# Patient Record
Sex: Female | Born: 1995 | Race: Black or African American | Hispanic: No | Marital: Single | Smoking: Never smoker
Health system: Southern US, Community
[De-identification: ages and names within clinical notes are randomized; demographics above are authoritative.]

## PROBLEM LIST (undated history)

## (undated) ENCOUNTER — Inpatient Hospital Stay (HOSPITAL_COMMUNITY): Payer: Self-pay

## (undated) ENCOUNTER — Inpatient Hospital Stay (HOSPITAL_COMMUNITY): Admission: RE | Payer: Self-pay | Source: Ambulatory Visit

## (undated) DIAGNOSIS — O34219 Maternal care for unspecified type scar from previous cesarean delivery: Secondary | ICD-10-CM

## (undated) DIAGNOSIS — Z789 Other specified health status: Secondary | ICD-10-CM

## (undated) DIAGNOSIS — Z3490 Encounter for supervision of normal pregnancy, unspecified, unspecified trimester: Secondary | ICD-10-CM

## (undated) DIAGNOSIS — O0932 Supervision of pregnancy with insufficient antenatal care, second trimester: Secondary | ICD-10-CM

## (undated) DIAGNOSIS — F32A Depression, unspecified: Secondary | ICD-10-CM

## (undated) HISTORY — DX: Depression, unspecified: F32.A

## (undated) HISTORY — PX: FRACTURE SURGERY: SHX138

## (undated) HISTORY — PX: OTHER SURGICAL HISTORY: SHX169

---
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
## 2016-07-21 ENCOUNTER — Inpatient Hospital Stay (HOSPITAL_COMMUNITY)
Admission: AD | Admit: 2016-07-21 | Discharge: 2016-07-21 | Disposition: A | Discharge status: Home / Self Care | Payer: Medicaid Other | Source: Ambulatory Visit | Attending: Obstetrics & Gynecology | Admitting: Obstetrics & Gynecology

## 2016-07-21 ENCOUNTER — Encounter (HOSPITAL_COMMUNITY): Payer: Self-pay | Admitting: *Deleted

## 2016-07-21 ENCOUNTER — Inpatient Hospital Stay (HOSPITAL_COMMUNITY): Payer: Medicaid Other

## 2016-07-21 ENCOUNTER — Other Ambulatory Visit: Payer: Self-pay | Admitting: Family

## 2016-07-21 DIAGNOSIS — O0001 Abdominal pregnancy with intrauterine pregnancy: Secondary | ICD-10-CM | POA: Diagnosis not present

## 2016-07-21 DIAGNOSIS — Z3A13 13 weeks gestation of pregnancy: Secondary | ICD-10-CM | POA: Insufficient documentation

## 2016-07-21 DIAGNOSIS — Z789 Other specified health status: Secondary | ICD-10-CM

## 2016-07-21 DIAGNOSIS — IMO0002 Reserved for concepts with insufficient information to code with codable children: Secondary | ICD-10-CM

## 2016-07-21 DIAGNOSIS — Z362 Encounter for other antenatal screening follow-up: Secondary | ICD-10-CM | POA: Diagnosis present

## 2016-07-21 DIAGNOSIS — Z3A15 15 weeks gestation of pregnancy: Secondary | ICD-10-CM | POA: Diagnosis not present

## 2016-07-21 DIAGNOSIS — Z3201 Encounter for pregnancy test, result positive: Secondary | ICD-10-CM

## 2016-07-21 DIAGNOSIS — Z3491 Encounter for supervision of normal pregnancy, unspecified, first trimester: Secondary | ICD-10-CM

## 2016-07-21 HISTORY — DX: Other specified health status: Z78.9

## 2016-07-21 LAB — CBC
HCT: 34.8 % — ABNORMAL LOW (ref 36.0–46.0)
HEMOGLOBIN: 12.3 g/dL (ref 12.0–15.0)
MCH: 30.4 pg (ref 26.0–34.0)
MCHC: 35.3 g/dL (ref 30.0–36.0)
MCV: 86.1 fL (ref 78.0–100.0)
PLATELETS: 236 10*3/uL (ref 150–400)
RBC: 4.04 MIL/uL (ref 3.87–5.11)
RDW: 13.3 % (ref 11.5–15.5)
WBC: 6 10*3/uL (ref 4.0–10.5)

## 2016-07-21 LAB — HCG, QUANTITATIVE, PREGNANCY: hCG, Beta Chain, Quant, S: 100589 m[IU]/mL — ABNORMAL HIGH (ref <5)

## 2016-07-21 LAB — ABO/RH: ABO/RH(D): O POS

## 2016-07-21 NOTE — Discharge Instructions (Signed)
How a Baby Grows During Pregnancy Introduction Pregnancy begins when a female's sperm enters a female's egg (fertilization). This happens in one of the tubes (fallopian tubes) that connect the ovaries to the womb (uterus). The fertilized egg is called an embryo until it reaches 10 weeks. From 10 weeks until birth, it is called a fetus. The fertilized egg moves down the fallopian tube to the uterus. Then it implants into the lining of the uterus and begins to grow. The developing fetus receives oxygen and nutrients through the pregnant woman's bloodstream and the tissues that grow (placenta) to support the fetus. The placenta is the life support system for the fetus. It provides nutrition and removes waste. Learning as much as you can about your pregnancy and how your baby is developing can help you enjoy the experience. It can also make you aware of when there might be a problem and when to ask questions. How long does a typical pregnancy last? A pregnancy usually lasts 280 days, or about 40 weeks. Pregnancy is divided into three trimesters:  First trimester: 0-13 weeks.  Second trimester: 14-27 weeks.  Third trimester: 28-40 weeks. The day when your baby is considered ready to be born (full term) is your estimated date of delivery. How does my baby develop month by month? First month  The fertilized egg attaches to the inside of the uterus.  Some cells will form the placenta. Others will form the fetus.  The arms, legs, brain, spinal cord, lungs, and heart begin to develop.  At the end of the first month, the heart begins to beat. Second month  The bones, inner ear, eyelids, hands, and feet form.  The genitals develop.  By the end of 8 weeks, all major organs are developing. Third month  All of the internal organs are forming.  Teeth develop below the gums.  Bones and muscles begin to grow. The spine can flex.  The skin is transparent.  Fingernails and toenails begin to  form.  Arms and legs continue to grow longer, and hands and feet develop.  The fetus is about 3 in (7.6 cm) long. Fourth month  The placenta is completely formed.  The external sex organs, neck, outer ear, eyebrows, eyelids, and fingernails are formed.  The fetus can hear, swallow, and move its arms and legs.  The kidneys begin to produce urine.  The skin is covered with a white waxy coating (vernix) and very fine hair (lanugo). Fifth month  The fetus moves around more and can be felt for the first time (quickening).  The fetus starts to sleep and wake up and may begin to suck its finger.  The nails grow to the end of the fingers.  The organ in the digestive system that makes bile (gallbladder) functions and helps to digest the nutrients.  If your baby is a girl, eggs are present in her ovaries. If your baby is a boy, testicles start to move down into his scrotum. Sixth month  The lungs are formed, but the fetus is not yet able to breathe.  The eyes open. The brain continues to develop.  Your baby has fingerprints and toe prints. Your baby's hair grows thicker.  At the end of the second trimester, the fetus is about 9 in (22.9 cm) long. Seventh month  The fetus kicks and stretches.  The eyes are developed enough to sense changes in light.  The hands can make a grasping motion.  The fetus responds to sound. Eighth month    All organs and body systems are fully developed and functioning.  Bones harden and taste buds develop. The fetus may hiccup.  Certain areas of the brain are still developing. The skull remains soft. Ninth month  The fetus gains about  lb (0.23 kg) each week.  The lungs are fully developed.  Patterns of sleep develop.  The fetus's head typically moves into a head-down position (vertex) in the uterus to prepare for birth. If the buttocks move into a vertex position instead, the baby is breech.  The fetus weighs 6-9 lbs (2.72-4.08 kg) and is  19-20 in (48.26-50.8 cm) long. What can I do to have a healthy pregnancy and help my baby develop?  Eating and Drinking  Eat a healthy diet.  Talk with your health care provider to make sure that you are getting the nutrients that you and your baby need.  Visit www.choosemyplate.gov to learn about creating a healthy diet.  Gain a healthy amount of weight during pregnancy as advised by your health care provider. This is usually 25-35 pounds. You may need to:  Gain more if you were underweight before getting pregnant or if you are pregnant with more than one baby.  Gain less if you were overweight or obese when you got pregnant. Medicines and Vitamins  Take prenatal vitamins as directed by your health care provider. These include vitamins such as folic acid, iron, calcium, and vitamin D. They are important for healthy development.  Take medicines only as directed by your health care provider. Read labels and ask a pharmacist or your health care provider whether over-the-counter medicines, supplements, and prescription drugs are safe to take during pregnancy. Activities  Be physically active as advised by your health care provider. Ask your health care provider to recommend activities that are safe for you to do, such as walking or swimming.  Do not participate in strenuous or extreme sports.  Lifestyle  Do not drink alcohol.  Do not use any tobacco products, including cigarettes, chewing tobacco, or electronic cigarettes. If you need help quitting, ask your health care provider.  Do not use illegal drugs. Safety  Avoid exposure to mercury, lead, or other heavy metals. Ask your health care provider about common sources of these heavy metals.  Avoid listeria infection during pregnancy. Follow these precautions:  Do not eat soft cheeses or deli meats.  Do not eat hot dogs unless they have been warmed up to the point of steaming, such as in the microwave oven.  Do not drink  unpasteurized milk.  Avoid toxoplasmosis infection during pregnancy. Follow these precautions:  Do not change your cat's litter box, if you have a cat. Ask someone else to do this for you.  Wear gardening gloves while working in the yard. General Instructions  Keep all follow-up visits as directed by your health care provider. This is important. This includes prenatal care and screening tests.  Manage any chronic health conditions. Work closely with your health care provider to keep conditions, such as diabetes, under control. How do I know if my baby is developing well? At each prenatal visit, your health care provider will do several different tests to check on your health and keep track of your baby's development. These include:  Fundal height.  Your health care provider will measure your growing belly from top to bottom using a tape measure.  Your health care provider will also feel your belly to determine your baby's position.  Heartbeat.  An ultrasound in the first trimester   can confirm pregnancy and show a heartbeat, depending on how far along you are.  Your health care provider will check your baby's heart rate at every prenatal visit.  As you get closer to your delivery date, you may have regular fetal heart rate monitoring to make sure that your baby is not in distress.  Second trimester ultrasound.  This ultrasound checks your baby's development. It also indicates your baby's gender. What should I do if I have concerns about my baby's development? Always talk with your health care provider about any concerns that you may have. This information is not intended to replace advice given to you by your health care provider. Make sure you discuss any questions you have with your health care provider. Document Released: 12/03/2007 Document Revised: 11/22/2015 Document Reviewed: 11/23/2013  2017 [employer]  

## 2016-07-21 NOTE — [facility] Provider Note (Signed)
History     CSN: [id number]  Arrival date and time: 07/21/16 1125   First Provider Initiated Contact with Patient 07/21/16 1216      No chief complaint on file.  HPI   [name] is a 21 y.o. female G1P0 @ [redacted]w[redacted]d by uncertain LMP here in [facility] with concerns about her baby. She was seen today by her school nurse and the nurse was unable to doppler fetal heart tones. She denies abdominal pain or vaginal bleeding.   OB History    Gravida Para Term Preterm AB Living   1         0   SAB TAB Ectopic Multiple Live Births                  Past Medical History:  Diagnosis Date  . Medical history non-contributory     Past Surgical History:  Procedure Laterality Date  . NO PAST SURGERIES      History reviewed. No pertinent family history.  Social History  Substance Use Topics  . Smoking status: Never Smoker  . Smokeless tobacco: Never Used  . Alcohol use No    Allergies: No Known Allergies  Prescriptions Prior to Admission  Medication Sig Dispense Refill Last Dose  . acetaminophen (TYLENOL) 500 MG tablet Take 1,000 mg by mouth every 6 (six) hours as needed for mild pain or headache.   Past Week at Unknown time  . Prenatal Vit-Fe Fumarate-FA (PRENATAL MULTIVITAMIN) TABS tablet Take 1 tablet by mouth at bedtime.   07/20/2016 at Unknown time   Results for orders placed or performed during the hospital encounter of 07/21/16 (from the past 48 hour(s))  hCG, quantitative, pregnancy     Status: Abnormal   Collection Time: 07/21/16 12:26 PM  Result Value Ref Range   hCG, Beta Chain, Quant, S 100,589 (H) <5 mIU/mL    Comment:          GEST. AGE      CONC.  (mIU/mL)   <=1 WEEK        5 - 50     2 WEEKS       50 - 500     3 WEEKS       100 - 10,000     4 WEEKS     1,000 - 30,000     5 WEEKS     3,500 - 115,000   6-8 WEEKS     12,000 - 270,000    12 WEEKS     15,000 - 220,000        FEMALE AND NON-PREGNANT FEMALE:     LESS THAN 5 mIU/mL   ABO/Rh     Status: None   Collection Time: 07/21/16 12:26 PM  Result Value Ref Range   ABO/RH(D) O POS   CBC     Status: Abnormal   Collection Time: 07/21/16 12:26 PM  Result Value Ref Range   WBC 6.0 4.0 - 10.5 K/uL   RBC 4.04 3.87 - 5.11 MIL/uL   Hemoglobin 12.3 12.0 - 15.0 g/dL   HCT 40.9 (L) 81.1 - 91.4 %   MCV 86.1 78.0 - 100.0 fL   MCH 30.4 26.0 - 34.0 pg   MCHC 35.3 30.0 - 36.0 g/dL   RDW 78.2 95.6 - 21.3 %   Platelets 236 150 - 400 K/uL   US Ob Comp Less 14 Wks  Result Date: 07/21/2016 CLINICAL DATA:  Pregnant, size less than dates on physical exam EXAM: OBSTETRIC <14  WK ULTRASOUND TECHNIQUE: Transabdominal ultrasound was performed for evaluation of the gestation as well as the maternal uterus and adnexal regions. COMPARISON:  None. FINDINGS: Intrauterine gestational sac: Single Yolk sac:  Visualized. Embryo:  Visualized. Cardiac Activity: Visualized. Heart Rate: 154 bpm CRL:   74.5  mm   13 w 4 d                  US EDC: 01/22/2017 Subchorionic hemorrhage:  None visualized. Maternal uterus/adnexae: Bilateral ovaries are within normal limits. No free fluid. IMPRESSION: Single live intrauterine gestation, with estimated gestational age [redacted] weeks 4 days by crown-rump length. Electronically Signed   By: Charline BillsSriyesh  Krishnan M.D.   On: 07/21/2016 13:55   Review of Systems  Gastrointestinal: Negative for abdominal pain.  Genitourinary: Negative for dysuria and vaginal bleeding.   Physical Exam   Blood pressure 116/67, pulse 85, temperature 97.7 F (36.5 C), temperature source Oral, resp. rate 16, height 5\' 8"  (1.727 m), weight 211 lb (95.7 kg), last menstrual period 04/01/2016.  Physical Exam  Constitutional: She is oriented to person, place, and time. She appears well-developed and well-nourished. No distress.  HENT:  Head: Normocephalic.  Respiratory: Effort normal.  GI: Soft.  Musculoskeletal: Normal range of motion.  Neurological: She is alert and oriented to person, place, and time.  Skin: Skin is  warm. She is not diaphoretic.  Psychiatric: Her behavior is normal.    MAU Course  Procedures  None  MDM  ABO/RH CBC Hcg US  Unable to doppler fetal heart tones via doppler   Assessment and Plan   A:  SIUP @ [id number]  1. Normal IUP (intrauterine pregnancy) on prenatal ultrasound, first trimester   2. Absent fetal heart tones   3. Date of last menstrual period (LMP) unknown     P:  Discharge home in stable condition  Start prenatal care.  Return to MAU if symptoms worsen  Prenatal vitamins.    Duane LopeJennifer I Anastasio Wogan, NP 07/21/2016 4:07 PM

## 2016-07-21 NOTE — MAU Note (Addendum)
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  Was seen today at [facility], they could not hear a FHR.  Pt denies pain or bleeding.  [facility]

## 2016-07-25 ENCOUNTER — Ambulatory Visit (HOSPITAL_COMMUNITY): Payer: Medicaid Other

## 2016-08-01 ENCOUNTER — Ambulatory Visit (HOSPITAL_COMMUNITY): Admission: RE | Admit: 2016-08-01 | Payer: Medicaid Other | Source: Ambulatory Visit

## 2016-09-13 ENCOUNTER — Inpatient Hospital Stay (HOSPITAL_COMMUNITY)
Admission: AD | Admit: 2016-09-13 | Discharge: 2016-09-13 | Disposition: A | Discharge status: Home / Self Care | Payer: Medicaid Other | Source: Ambulatory Visit | Attending: Obstetrics & Gynecology | Admitting: Obstetrics & Gynecology

## 2016-09-13 ENCOUNTER — Encounter (HOSPITAL_COMMUNITY): Payer: Self-pay | Admitting: *Deleted

## 2016-09-13 ENCOUNTER — Inpatient Hospital Stay (HOSPITAL_COMMUNITY): Payer: Medicaid Other

## 2016-09-13 DIAGNOSIS — O26899 Other specified pregnancy related conditions, unspecified trimester: Secondary | ICD-10-CM

## 2016-09-13 DIAGNOSIS — O368120 Decreased fetal movements, second trimester, not applicable or unspecified: Secondary | ICD-10-CM | POA: Diagnosis not present

## 2016-09-13 DIAGNOSIS — Z79899 Other long term (current) drug therapy: Secondary | ICD-10-CM | POA: Diagnosis not present

## 2016-09-13 DIAGNOSIS — Z3A21 21 weeks gestation of pregnancy: Secondary | ICD-10-CM

## 2016-09-13 DIAGNOSIS — R109 Unspecified abdominal pain: Secondary | ICD-10-CM | POA: Insufficient documentation

## 2016-09-13 DIAGNOSIS — O212 Late vomiting of pregnancy: Secondary | ICD-10-CM | POA: Diagnosis not present

## 2016-09-13 DIAGNOSIS — O26892 Other specified pregnancy related conditions, second trimester: Secondary | ICD-10-CM | POA: Insufficient documentation

## 2016-09-13 DIAGNOSIS — O0932 Supervision of pregnancy with insufficient antenatal care, second trimester: Secondary | ICD-10-CM | POA: Insufficient documentation

## 2016-09-13 DIAGNOSIS — O368190 Decreased fetal movements, unspecified trimester, not applicable or unspecified: Secondary | ICD-10-CM | POA: Diagnosis present

## 2016-09-13 DIAGNOSIS — Z3492 Encounter for supervision of normal pregnancy, unspecified, second trimester: Secondary | ICD-10-CM

## 2016-09-13 DIAGNOSIS — O219 Vomiting of pregnancy, unspecified: Secondary | ICD-10-CM

## 2016-09-13 DIAGNOSIS — O9989 Other specified diseases and conditions complicating pregnancy, childbirth and the puerperium: Secondary | ICD-10-CM

## 2016-09-13 DIAGNOSIS — Z363 Encounter for antenatal screening for malformations: Secondary | ICD-10-CM | POA: Diagnosis not present

## 2016-09-13 LAB — WET PREP, GENITAL
CLUE CELLS WET PREP: NONE SEEN
SPERM: NONE SEEN
TRICH WET PREP: NONE SEEN
YEAST WET PREP: NONE SEEN

## 2016-09-13 LAB — URINALYSIS, ROUTINE W REFLEX MICROSCOPIC
BILIRUBIN URINE: NEGATIVE
Glucose, UA: NEGATIVE mg/dL
Hgb urine dipstick: NEGATIVE
Ketones, ur: NEGATIVE mg/dL
LEUKOCYTES UA: NEGATIVE
NITRITE: NEGATIVE
PH: 8 (ref 5.0–8.0)
Protein, ur: NEGATIVE mg/dL
SPECIFIC GRAVITY, URINE: 1.012 (ref 1.005–1.030)

## 2016-09-13 MED ORDER — ONDANSETRON 8 MG PO TBDP
8.0000 mg | ORAL_TABLET | Freq: Once | ORAL | Status: AC
  Administered 2016-09-13: 8 mg via ORAL
  Filled 2016-09-13: qty 1

## 2016-09-13 MED ORDER — ONDANSETRON 4 MG PO TBDP: 4.0000 mg | ORAL_TABLET | Freq: Three times a day (TID) | ORAL | 0 refills | Status: DC | PRN

## 2016-09-13 NOTE — Discharge Instructions (Signed)
[facility] Prenatal Care Providers   Center for [facility] at [facility]       Phone: [phone]  Center for [facility] at [place] Phone: [phone]  [facility] [place]  Phone: [phone]  [facility] at [facility]  Phone: [phone]  Center for [facility] at [place]  Phone: [phone]  [facility]       Phone: [phone]  [facility]    Phone: [phone]   [facility])    Phone: [phone]  [facility]    Phone: [phone]  [facility]    Phone: [phone]   [facility]  Phone: [phone]  [name]    Phone: [phone]  [facility]   Phone: [phone]          Safe Medications in Pregnancy   Acne: Benzoyl Peroxide Salicylic Acid  Backache/Headache: Tylenol: 2 regular strength every 4 hours OR              2 Extra strength every 6 hours  Colds/Coughs/Allergies: Benadryl (alcohol free) 25 mg every 6 hours as needed Breath right strips Claritin Cepacol throat lozenges Chloraseptic throat spray Cold-Eeze- up to three times per day Cough drops, alcohol free Flonase (by prescription only) Guaifenesin Mucinex Robitussin DM (plain only, alcohol free) Saline nasal spray/drops Sudafed (pseudoephedrine) & Actifed ** use only after [redacted] weeks gestation and if you do not have high blood pressure Tylenol Vicks Vaporub Zinc lozenges Zyrtec   Constipation: Colace Ducolax suppositories Fleet enema Glycerin suppositories Metamucil Milk of magnesia Miralax Senokot Smooth move tea  Diarrhea: Kaopectate Imodium A-D  *NO pepto Bismol  Hemorrhoids: Anusol Anusol HC Preparation H Tucks  Indigestion: Tums Maalox Mylanta Zantac  Pepcid  Insomnia: Benadryl  (alcohol free) 25mg  every 6 hours as needed Tylenol PM Unisom, no Gelcaps  Leg Cramps: Tums MagGel  Nausea/Vomiting:  Bonine Dramamine Emetrol Ginger extract Sea bands Meclizine  Nausea medication to take during pregnancy:  Unisom (doxylamine succinate 25 mg tablets) Take one tablet daily at bedtime. If symptoms are not adequately controlled, the dose can be increased to a maximum recommended dose of two tablets daily (1/2 tablet in the morning, 1/2 tablet mid-afternoon and one at bedtime). Vitamin B6 100mg  tablets. Take one tablet twice a day (up to 200 mg per day).  Skin Rashes: Aveeno products Benadryl cream or 25mg  every 6 hours as needed Calamine Lotion 1% cortisone cream  Yeast infection: Gyne-lotrimin 7 Monistat 7  Gum/tooth pain: Anbesol  **If taking multiple medications, please check labels to avoid duplicating the same active ingredients **take medication as directed on the label ** Do not exceed 4000 mg of tylenol in 24 hours **Do not take medications that contain aspirin or ibuprofen        Second Trimester of Pregnancy The second trimester is from week 14 through week 27 (months 4 through 6). The second trimester is often a time when you feel your best. Your body has adjusted to being pregnant, and you begin to feel better physically. Usually, morning sickness has lessened or quit completely, you may have more energy, and you may have an increase in appetite. The second trimester is also a time when the fetus is growing rapidly. At the end of the sixth month, the fetus is about 9 inches long and weighs about 1 pounds. You will likely begin to feel the baby move (quickening)  between 16 and 20 weeks of pregnancy. Body changes during your second trimester Your body continues to go through many changes during your second trimester. The changes vary from woman to woman.  Your weight will continue to increase. You will notice your lower abdomen bulging  out.  You may begin to get stretch marks on your hips, abdomen, and breasts.  You may develop headaches that can be relieved by medicines. The medicines should be approved by your health care provider.  You may urinate more often because the fetus is pressing on your bladder.  You may develop or continue to have heartburn as a result of your pregnancy.  You may develop constipation because certain hormones are causing the muscles that push waste through your intestines to slow down.  You may develop hemorrhoids or swollen, bulging veins (varicose veins).  You may have back pain. This is caused by:  Weight gain.  Pregnancy hormones that are relaxing the joints in your pelvis.  A shift in weight and the muscles that support your balance.  Your breasts will continue to grow and they will continue to become tender.  Your gums may bleed and may be sensitive to brushing and flossing.  Dark spots or blotches (chloasma, mask of pregnancy) may develop on your face. This will likely fade after the baby is born.  A dark line from your belly button to the pubic area (linea nigra) may appear. This will likely fade after the baby is born.  You may have changes in your hair. These can include thickening of your hair, rapid growth, and changes in texture. Some women also have hair loss during or after pregnancy, or hair that feels dry or thin. Your hair will most likely return to normal after your baby is born. What to expect at prenatal visits During a routine prenatal visit:  You will be weighed to make sure you and the fetus are growing normally.  Your blood pressure will be taken.  Your abdomen will be measured to track your baby's growth.  The fetal heartbeat will be listened to.  Any test results from the previous visit will be discussed. Your health care provider may ask you:  How you are feeling.  If you are feeling the baby move.  If you have had any abnormal symptoms, such  as leaking fluid, bleeding, severe headaches, or abdominal cramping.  If you are using any tobacco products, including cigarettes, chewing tobacco, and electronic cigarettes.  If you have any questions. Other tests that may be performed during your second trimester include:  Blood tests that check for:  Low iron levels (anemia).  High blood sugar that affects pregnant women (gestational diabetes) between 2624 and 28 weeks.  Rh antibodies. This is to check for a protein on red blood cells (Rh factor).  Urine tests to check for infections, diabetes, or protein in the urine.  An ultrasound to confirm the proper growth and development of the baby.  An amniocentesis to check for possible genetic problems.  Fetal screens for spina bifida and Down syndrome.  HIV (human immunodeficiency virus) testing. Routine prenatal testing includes screening for HIV, unless you choose not to have this test. Follow these instructions at home: Medicines   Follow your health care provider's instructions regarding medicine use. Specific medicines may be either safe or unsafe to take during pregnancy.  Take a prenatal vitamin that contains at least 600 micrograms (mcg) of folic acid.  If you develop constipation, try taking a stool  softener if your health care provider approves. Eating and drinking   Eat a balanced diet that includes fresh fruits and vegetables, whole grains, good sources of protein such as meat, eggs, or tofu, and low-fat dairy. Your health care provider will help you determine the amount of weight gain that is right for you.  Avoid raw meat and uncooked cheese. These carry germs that can cause birth defects in the baby.  If you have low calcium intake from food, talk to your health care provider about whether you should take a daily calcium supplement.  Limit foods that are high in fat and processed sugars, such as fried and sweet foods.  To prevent constipation:  Drink enough  fluid to keep your urine clear or pale yellow.  Eat foods that are high in fiber, such as fresh fruits and vegetables, whole grains, and beans. Activity   Exercise only as directed by your health care provider. Most women can continue their usual exercise routine during pregnancy. Try to exercise for 30 minutes at least 5 days a week. Stop exercising if you experience uterine contractions.  Avoid heavy lifting, wear low heel shoes, and practice good posture.  A sexual relationship may be continued unless your health care provider directs you otherwise. Relieving pain and discomfort   Wear a good support bra to prevent discomfort from breast tenderness.  Take warm sitz baths to soothe any pain or discomfort caused by hemorrhoids. Use hemorrhoid cream if your health care provider approves.  Rest with your legs elevated if you have leg cramps or low back pain.  If you develop varicose veins, wear support hose. Elevate your feet for 15 minutes, 3-4 times a day. Limit salt in your diet. Prenatal Care   Write down your questions. Take them to your prenatal visits.  Keep all your prenatal visits as told by your health care provider. This is important. Safety   Wear your seat belt at all times when driving.  Make a list of emergency phone numbers, including numbers for family, friends, the hospital, and police and fire departments. General instructions   Ask your health care provider for a referral to a local prenatal education class. Begin classes no later than the beginning of month 6 of your pregnancy.  Ask for help if you have counseling or nutritional needs during pregnancy. Your health care provider can offer advice or refer you to specialists for help with various needs.  Do not use hot tubs, steam rooms, or saunas.  Do not douche or use tampons or scented sanitary pads.  Do not cross your legs for long periods of time.  Avoid cat litter boxes and soil used by cats. These  carry germs that can cause birth defects in the baby and possibly loss of the fetus by miscarriage or stillbirth.  Avoid all smoking, herbs, alcohol, and unprescribed drugs. Chemicals in these products can affect the formation and growth of the baby.  Do not use any products that contain nicotine or tobacco, such as cigarettes and e-cigarettes. If you need help quitting, ask your health care provider.  Visit your dentist if you have not gone yet during your pregnancy. Use a soft toothbrush to brush your teeth and be gentle when you floss. Contact a health care provider if:  You have dizziness.  You have mild pelvic cramps, pelvic pressure, or nagging pain in the abdominal area.  You have persistent nausea, vomiting, or diarrhea.  You have a bad smelling vaginal discharge.  You have pain when you urinate. Get help right away if:  You have a fever.  You are leaking fluid from your vagina.  You have spotting or bleeding from your vagina.  You have severe abdominal cramping or pain.  You have rapid weight gain or weight loss.  You have shortness of breath with chest pain.  You notice sudden or extreme swelling of your face, hands, ankles, feet, or legs.  You have not felt your baby move in over an hour.  You have severe headaches that do not go away when you take medicine.  You have vision changes. Summary  The second trimester is from week 14 through week 27 (months 4 through 6). It is also a time when the fetus is growing rapidly.  Your body goes through many changes during pregnancy. The changes vary from woman to woman.  Avoid all smoking, herbs, alcohol, and unprescribed drugs. These chemicals affect the formation and growth your baby.  Do not use any tobacco products, such as cigarettes, chewing tobacco, and e-cigarettes. If you need help quitting, ask your health care provider.  Contact your health care provider if you have any questions. Keep all prenatal visits  as told by your health care provider. This is important. This information is not intended to replace advice given to you by your health care provider. Make sure you discuss any questions you have with your health care provider. Document Released: 06/10/2001 Document Revised: 11/22/2015 Document Reviewed: 08/17/2012 Elsevier Interactive Patient Education  2017 [employer].

## 2016-09-13 NOTE — MAU Note (Signed)
Pt C/O lower abd cramping for the last 3 days, denies bleeding or LOF.  Had had decreased FM for the last 2 days.  Has been vomiting, states it is an ongoing issue.

## 2016-09-13 NOTE — MAU Provider Note (Signed)
History     CSN: [id number]  Arrival date and time: 09/13/16 1137   First Provider Initiated Contact with Patient 09/13/16 1213      Chief Complaint  Patient presents with  . Abdominal Pain  . Decreased Fetal Movement   HPI Claudia Moon is a 21 y.o. G1P0 at [redacted]w[redacted]d who presents with abdominal cramping. Symptoms started 3 days ago. Reports intermittent lower abdominal pain that she describes as cramping. Pain occurs ~3 times per day and lasts about 10 minutes at a time. Has not treated pain. Nothing makes pain better or worse. Rates pain 6/10.  Endorses nausea & vomiting that has continued throughout pregnancy. Has not treated symptoms. Has daily nausea and vomited once yesterday. Some thick white vaginal discharge; no odor or irritation. Denies vaginal bleeding, LOF, dysuria, fever, diarrhea, or constipation. Last BM this morning. Last intercourse yesterday.  Has not started prenatal care; states the offices she has called either don't accept medicaid or won't accept her b/c she's >[redacted] weeks pregnant.  Has not felt movement today. Felt baby move 3 times yesterday. States she normally feels movements 5 times per day.    OB History    Gravida Para Term Preterm AB Living   1         0   SAB TAB Ectopic Multiple Live Births                  Past Medical History:  Diagnosis Date  . Medical history non-contributory     Past Surgical History:  Procedure Laterality Date  . NO PAST SURGERIES      No family history on file.  Social History  Substance Use Topics  . Smoking status: Never Smoker  . Smokeless tobacco: Never Used  . Alcohol use No    Allergies: No Known Allergies  Prescriptions Prior to Admission  Medication Sig Dispense Refill Last Dose  . acetaminophen (TYLENOL) 500 MG tablet Take 1,000 mg by mouth every 6 (six) hours as needed for mild pain or headache.   Past Week at Unknown time  . Prenatal Vit-Fe Fumarate-FA (PRENATAL MULTIVITAMIN) TABS tablet Take 1 tablet  by mouth at bedtime.   07/20/2016 at Unknown time    Review of Systems  Constitutional: Negative.   Gastrointestinal: Positive for abdominal pain, nausea and vomiting. Negative for constipation and diarrhea.  Genitourinary: Positive for vaginal discharge. Negative for dyspareunia, dysuria and vaginal bleeding.  Musculoskeletal: Negative for back pain.   Physical Exam   Blood pressure 115/70, pulse 80, temperature 98.5 F (36.9 C), temperature source Oral, resp. rate 18, last menstrual period 04/01/2016.  Physical Exam  Nursing note and vitals reviewed. Constitutional: She is oriented to person, place, and time. She appears well-developed and well-nourished. No distress.  HENT:  Head: Normocephalic and atraumatic.  Eyes: Conjunctivae are normal. Right eye exhibits no discharge. Left eye exhibits no discharge. No scleral icterus.  Neck: Normal range of motion.  Cardiovascular: Normal rate, regular rhythm and normal heart sounds.   No murmur heard. Respiratory: Effort normal and breath sounds normal. No respiratory distress. She has no wheezes.  GI: Soft. Bowel sounds are normal. There is no tenderness.  Genitourinary: Cervix exhibits no motion tenderness and no friability. No bleeding in the vagina. Vaginal discharge (small amount of thin white discharge) found.  Neurological: She is alert and oriented to person, place, and time.  Skin: Skin is warm and dry. She is not diaphoretic.  Psychiatric: She has a normal mood and affect.  Her behavior is normal. Judgment and thought content normal.   Dilation: Closed (externally 1 cm) Effacement (%): Thick Cervical Position: Posterior Exam by:: Judeth HornErin Jalani Rominger NP  MAU Course  Procedures Results for orders placed or performed during the hospital encounter of 09/13/16 (from the past 24 hour(s))  Urinalysis, Routine w reflex microscopic     Status: Abnormal   Collection Time: 09/13/16 11:49 AM  Result Value Ref Range   Color, Urine YELLOW  YELLOW   APPearance HAZY (A) CLEAR   Specific Gravity, Urine 1.012 1.005 - 1.030   pH 8.0 5.0 - 8.0   Glucose, UA NEGATIVE NEGATIVE mg/dL   Hgb urine dipstick NEGATIVE NEGATIVE   Bilirubin Urine NEGATIVE NEGATIVE   Ketones, ur NEGATIVE NEGATIVE mg/dL   Protein, ur NEGATIVE NEGATIVE mg/dL   Nitrite NEGATIVE NEGATIVE   Leukocytes, UA NEGATIVE NEGATIVE  Wet prep, genital     Status: Abnormal   Collection Time: 09/13/16 12:28 PM  Result Value Ref Range   Yeast Wet Prep HPF POC NONE SEEN NONE SEEN   Trich, Wet Prep NONE SEEN NONE SEEN   Clue Cells Wet Prep HPF POC NONE SEEN NONE SEEN   WBC, Wet Prep HPF POC FEW (A) NONE SEEN   Sperm NONE SEEN     MDM FHT 150 by doppler SVE, ext 1cm but closed TVUS for CL, 3.3 cm Zofran odt NAD, VSS Will order outpatient anatomy scan & discuss scheduling prenatal care with patient Assessment and Plan  A; 1. Abdominal pain affecting pregnancy   2. [redacted] weeks gestation of pregnancy   3. Fetal heart rate present, second trimester   4. Insufficient prenatal care in second trimester   5. Nausea and vomiting during pregnancy prior to [redacted] weeks gestation   6. Encounter for antenatal screening for malformation using ultrasound    P: Discharge home Rx zofran Discussed reasons to return to [facility]U [facility] Outpatient anatomy ultrasound ordered GC/CT pending   Judeth Hornrin Marny Smethers 09/13/2016, 12:12 PM

## 2016-09-15 LAB — GC/CHLAMYDIA PROBE AMP (~~LOC~~) NOT AT ARMC
Chlamydia: NEGATIVE
NEISSERIA GONORRHEA: NEGATIVE

## 2016-09-17 ENCOUNTER — Telehealth: Payer: Self-pay | Admitting: *Deleted

## 2016-09-17 NOTE — Telephone Encounter (Signed)
Pt left message stating that she has been waiting for appt to be scheduled and has not received a call. Per chart review, pt has appt @ [facility] on 4/2 @ 1330 to initiate prenatal care. I called pt and she did not answer. I could not leave message because she does not have voice mail set up.

## 2016-09-18 NOTE — Telephone Encounter (Signed)
Called patient, no answer- unable to leave message 

## 2016-09-28 ENCOUNTER — Other Ambulatory Visit: Payer: Self-pay | Admitting: Student

## 2016-09-28 ENCOUNTER — Inpatient Hospital Stay (HOSPITAL_COMMUNITY)
Admission: AD | Admit: 2016-09-28 | Discharge: 2016-09-28 | Disposition: A | Discharge status: Home / Self Care | Payer: Medicaid Other | Source: Ambulatory Visit | Attending: Obstetrics and Gynecology | Admitting: Obstetrics and Gynecology

## 2016-09-28 ENCOUNTER — Encounter (HOSPITAL_COMMUNITY): Payer: Self-pay

## 2016-09-28 DIAGNOSIS — Z79899 Other long term (current) drug therapy: Secondary | ICD-10-CM | POA: Diagnosis not present

## 2016-09-28 DIAGNOSIS — Z3A23 23 weeks gestation of pregnancy: Secondary | ICD-10-CM | POA: Insufficient documentation

## 2016-09-28 DIAGNOSIS — N760 Acute vaginitis: Secondary | ICD-10-CM | POA: Diagnosis not present

## 2016-09-28 DIAGNOSIS — O23593 Infection of other part of genital tract in pregnancy, third trimester: Secondary | ICD-10-CM | POA: Insufficient documentation

## 2016-09-28 DIAGNOSIS — N898 Other specified noninflammatory disorders of vagina: Secondary | ICD-10-CM | POA: Diagnosis present

## 2016-09-28 LAB — URINALYSIS, ROUTINE W REFLEX MICROSCOPIC
Bilirubin Urine: NEGATIVE
GLUCOSE, UA: NEGATIVE mg/dL
HGB URINE DIPSTICK: NEGATIVE
Ketones, ur: NEGATIVE mg/dL
Leukocytes, UA: NEGATIVE
Nitrite: NEGATIVE
PH: 8 (ref 5.0–8.0)
Protein, ur: NEGATIVE mg/dL
SPECIFIC GRAVITY, URINE: 1.013 (ref 1.005–1.030)

## 2016-09-28 LAB — WET PREP, GENITAL
Clue Cells Wet Prep HPF POC: NONE SEEN
SPERM: NONE SEEN
Trich, Wet Prep: NONE SEEN
Yeast Wet Prep HPF POC: NONE SEEN

## 2016-09-28 MED ORDER — TERCONAZOLE 0.8 % VA CREA: 1.0000 | TOPICAL_CREAM | Freq: Every day | VAGINAL | 0 refills | Status: AC

## 2016-09-28 MED ORDER — NYSTATIN 100000 UNIT/GM EX CREA: 1.0000 "application " | TOPICAL_CREAM | Freq: Two times a day (BID) | CUTANEOUS | 0 refills | Status: DC

## 2016-09-28 MED ORDER — TRIAMCINOLONE ACETONIDE 0.1 % EX CREA: 1.0000 "application " | TOPICAL_CREAM | Freq: Two times a day (BID) | CUTANEOUS | 0 refills | Status: DC

## 2016-09-28 MED ORDER — NYSTATIN-TRIAMCINOLONE 100000-0.1 UNIT/GM-% EX CREA: TOPICAL_CREAM | CUTANEOUS | 0 refills | Status: DC

## 2016-09-28 NOTE — Progress Notes (Signed)
Received call from pt stating [facility] is not covered by her Medicaid. Contacted attending, Denny Peon to make aware. She will follow up with [facility]. Isidoro Donning RN CCM Case Mgmt phone [phone]

## 2016-09-28 NOTE — Discharge Instructions (Signed)
Vaginitis Vaginitis is an inflammation of the vagina. It is most often caused by a change in the normal balance of the bacteria and yeast that live in the vagina. This change in balance causes an overgrowth of certain bacteria or yeast, which causes the inflammation. There are different types of vaginitis, but the most common types are:  Bacterial vaginosis.  Yeast infection (candidiasis).  Trichomoniasis vaginitis. This is a sexually transmitted infection (STI).  Viral vaginitis.  Atrophic vaginitis.  Allergic vaginitis. What are the causes? The cause depends on the type of vaginitis. Vaginitis can be caused by:  Bacteria (bacterial vaginosis).  Yeast (yeast infection).  A parasite (trichomoniasis vaginitis)  A virus (viral vaginitis).  Low hormone levels (atrophic vaginitis). Low hormone levels can occur during pregnancy, breastfeeding, or after menopause.  Irritants, such as bubble baths, scented tampons, and feminine sprays (allergic vaginitis). Other factors can change the normal balance of the yeast and bacteria that live in the vagina. These include:  Antibiotic medicines.  Poor hygiene.  Diaphragms, vaginal sponges, spermicides, birth control pills, and intrauterine devices (IUD).  Sexual intercourse.  Infection.  Uncontrolled diabetes.  A weakened immune system. What are the signs or symptoms? Symptoms can vary depending on the cause of the vaginitis. Common symptoms include:  Abnormal vaginal discharge.  The discharge is white, gray, or yellow with bacterial vaginosis.  The discharge is thick, white, and cheesy with a yeast infection.  The discharge is frothy and yellow or greenish with trichomoniasis.  A bad vaginal odor.  The odor is fishy with bacterial vaginosis.  Vaginal itching, pain, or swelling.  Painful intercourse.  Pain or burning when urinating. Sometimes there are no symptoms. How is this treated? Treatment will vary depending on  the type of infection.  Bacterial vaginosis and trichomoniasis are often treated with antibiotic creams or pills.  Yeast infections are often treated with antifungal medicines, such as vaginal creams or suppositories.  Viral vaginitis has no cure, but symptoms can be treated with medicines that relieve discomfort. Your sexual partner should be treated as well.  Atrophic vaginitis may be treated with an estrogen cream, pill, suppository, or vaginal ring. If vaginal dryness occurs, lubricants and moisturizing creams may help. You may be told to avoid scented soaps, sprays, or douches.  Allergic vaginitis treatment involves quitting the use of the product that is causing the problem. Vaginal creams can be used to treat the symptoms. Follow these instructions at home:  Take all medicines as directed by your caregiver.  Keep your genital area clean and dry. Avoid soap and only rinse the area with water.  Avoid douching. It can remove the healthy bacteria in the vagina.  Do not use tampons or have sexual intercourse until your vaginitis has been treated. Use sanitary pads while you have vaginitis.  Wipe from front to back. This avoids the spread of bacteria from the rectum to the vagina.  Let air reach your genital area. ? Wear cotton underwear to decrease moisture buildup.  Avoid wearing underwear while you sleep until your vaginitis is gone.  Avoid tight pants and underwear or nylons without a cotton panel.  Take off wet clothing (especially bathing suits) as soon as possible.  Use mild, non-scented products. Avoid using irritants, such as:  Scented feminine sprays.  Fabric softeners.  Scented detergents.  Scented tampons.  Scented soaps or bubble baths.  Practice safe sex and use condoms. Condoms may prevent the spread of trichomoniasis and viral vaginitis. Contact a health care   provider if:  You have abdominal pain.  You have symptoms that last for more than 2-3  days.  You have a fever and your symptoms suddenly get worse. This information is not intended to replace advice given to you by your health care provider. Make sure you discuss any questions you have with your health care provider. Document Released: 04/13/2007 Document Revised: 05/07/2016 Document Reviewed: 05/07/2016 Elsevier Interactive Patient Education  2017 [employer]  

## 2016-09-28 NOTE — MAU Note (Signed)
Having vaginal discomfort and itching, started yesterday. No bleeding, denies d/c.  Vag area is inflamed

## 2016-09-28 NOTE — MAU Provider Note (Signed)
History     CSN: [id number]  Arrival date and time: 09/28/16 4098   First Provider Initiated Contact with Patient 09/28/16 [phone]      Chief Complaint  Patient presents with  . vaginal discomfort  . Vaginal Itching   HPI Claudia Moon is a 21 y.o. G1P0 at [redacted]w[redacted]d who presents with vaginal irritation. Symptoms began yesterday. Reports vaginal itching, burning, and swelling. Pain worse when she voids. Has not noted discharge. Has tried no treatments. Denies abdominal pain, vaginal bleeding, or LOF. Positive fetal movement. Has appointment for initial prenatal visit tomorrow at [facility].   OB History    Gravida Para Term Preterm AB Living   1         0   SAB TAB Ectopic Multiple Live Births                  Past Medical History:  Diagnosis Date  . Medical history non-contributory     Past Surgical History:  Procedure Laterality Date  . NO PAST SURGERIES      History reviewed. No pertinent family history.  Social History  Substance Use Topics  . Smoking status: Never Smoker  . Smokeless tobacco: Never Used  . Alcohol use No    Allergies: No Known Allergies  Prescriptions Prior to Admission  Medication Sig Dispense Refill Last Dose  . acetaminophen (TYLENOL) 500 MG tablet Take 1,000 mg by mouth every 6 (six) hours as needed for mild pain or headache.   Past Month at Unknown time  . ondansetron (ZOFRAN ODT) 4 MG disintegrating tablet Take 1 tablet (4 mg total) by mouth every 8 (eight) hours as needed for nausea or vomiting. 15 tablet 0 Past Week at Unknown time  . Prenatal Vit-Fe Fumarate-FA (PRENATAL MULTIVITAMIN) TABS tablet Take 1 tablet by mouth at bedtime.   09/27/2016 at Unknown time  . albuterol (PROVENTIL HFA;VENTOLIN HFA) 108 (90 Base) MCG/ACT inhaler Inhale 2 puffs into the lungs every 6 (six) hours as needed for wheezing or shortness of breath.   prn    Review of Systems  Gastrointestinal: Negative.   Genitourinary: Positive for vaginal pain. Negative for  vaginal bleeding and vaginal discharge.   Physical Exam   Blood pressure 110/72, pulse 85, temperature 98.6 F (37 C), temperature source Oral, resp. rate 16, weight 220 lb 8 oz (100 kg), last menstrual period 04/01/2016.  Physical Exam  Nursing note and vitals reviewed. Constitutional: She is oriented to person, place, and time. She appears well-developed and well-nourished. No distress.  HENT:  Head: Normocephalic and atraumatic.  Eyes: Conjunctivae are normal. Right eye exhibits no discharge. Left eye exhibits no discharge. No scleral icterus.  Neck: Normal range of motion.  Respiratory: Effort normal. No respiratory distress.  GI: Soft. There is no tenderness.  Genitourinary: Cervix exhibits no friability. There is erythema in the vagina. No bleeding in the vagina. Vaginal discharge (small amount of yellow mucoid discharge) found.  Genitourinary Comments: Cervix visually closed.  Bilateral labia erythematous & edematous.   Neurological: She is alert and oriented to person, place, and time.  Skin: Skin is warm and dry. She is not diaphoretic.  Psychiatric: She has a normal mood and affect. Her behavior is normal. Judgment and thought content normal.   Fetal Tracing:  Baseline: 150 Variability: min to mod Accelerations: none Decelerations: small variables  Toco: none  MAU Course  Procedures Results for orders placed or performed during the hospital encounter of 09/28/16 (from the past 24 hour(s))  Urinalysis, Routine w reflex microscopic     Status: Abnormal   Collection Time: 09/28/16  9:34 AM  Result Value Ref Range   Color, Urine YELLOW YELLOW   APPearance HAZY (A) CLEAR   Specific Gravity, Urine 1.013 1.005 - 1.030   pH 8.0 5.0 - 8.0   Glucose, UA NEGATIVE NEGATIVE mg/dL   Hgb urine dipstick NEGATIVE NEGATIVE   Bilirubin Urine NEGATIVE NEGATIVE   Ketones, ur NEGATIVE NEGATIVE mg/dL   Protein, ur NEGATIVE NEGATIVE mg/dL   Nitrite NEGATIVE NEGATIVE   Leukocytes, UA  NEGATIVE NEGATIVE  Wet prep, genital     Status: Abnormal   Collection Time: 09/28/16 10:15 AM  Result Value Ref Range   Yeast Wet Prep HPF POC NONE SEEN NONE SEEN   Trich, Wet Prep NONE SEEN NONE SEEN   Clue Cells Wet Prep HPF POC NONE SEEN NONE SEEN   WBC, Wet Prep HPF POC MODERATE (A) NONE SEEN   Sperm NONE SEEN     MDM Fetal tracing appropriate for gestation GC/CT negative 2 weeks ago; will not repeat Wet prep collected - negative -- will tx based on symptoms & assessment Assessment and Plan  A: 1. Vaginitis and vulvovaginitis    P: Discharge home Rx terazol & mycolog Discussed reasons to return to MAU Keep follow up appointment with OB/PCP   [name] 09/28/2016, 9:52 AM

## 2016-09-29 ENCOUNTER — Ambulatory Visit (INDEPENDENT_AMBULATORY_CARE_PROVIDER_SITE_OTHER): Payer: Medicaid Other | Admitting: Certified Nurse Midwife

## 2016-09-29 ENCOUNTER — Encounter: Payer: Self-pay | Admitting: Certified Nurse Midwife

## 2016-09-29 VITALS — BP 97/45 | HR 118 | Wt 219.0 lb

## 2016-09-29 DIAGNOSIS — Z3402 Encounter for supervision of normal first pregnancy, second trimester: Secondary | ICD-10-CM

## 2016-09-29 DIAGNOSIS — Z3400 Encounter for supervision of normal first pregnancy, unspecified trimester: Secondary | ICD-10-CM

## 2016-09-29 MED ORDER — PRENATE PIXIE 10-0.6-0.4-200 MG PO CAPS: 1.0000 | ORAL_CAPSULE | Freq: Every day | ORAL | 12 refills | Status: DC

## 2016-09-29 NOTE — Progress Notes (Signed)
Patient is doing well. She does have an anatomy scan this week.

## 2016-09-29 NOTE — Progress Notes (Signed)
Subjective:    Claudia Moon is being seen today for her first obstetrical visit.  This is not a planned pregnancy. She is at [redacted]w[redacted]d gestation. Her obstetrical history is significant for none. Relationship with FOB: significant other, living together. Patient does intend to breast feed. Pregnancy history fully reviewed.  The information documented in the HPI was reviewed and verified.  Menstrual History: OB History    Gravida Para Term Preterm AB Living   1         0   SAB TAB Ectopic Multiple Live Births                   Patient's last menstrual period was 04/01/2016.    Past Medical History:  Diagnosis Date  . Medical history non-contributory     Past Surgical History:  Procedure Laterality Date  . NO PAST SURGERIES       (Not in a hospital admission) No Known Allergies  Social History  Substance Use Topics  . Smoking status: Never Smoker  . Smokeless tobacco: Never Used  . Alcohol use No    Family History  Problem Relation Age of Onset  . Stroke Mother   . Diabetes Maternal Grandmother   . Diabetes Maternal Grandfather      Review of Systems Constitutional: negative for weight loss Gastrointestinal: negative for vomiting Genitourinary:negative for genital lesions and vaginal discharge and dysuria Musculoskeletal:negative for back pain Behavioral/Psych: negative for abusive relationship, depression, illegal drug usage and tobacco use    Objective:    BP (!) 97/45   Pulse (!) 118   Wt 219 lb (99.3 kg)   LMP 04/01/2016   BMI 33.30 kg/m  General Appearance:    Alert, cooperative, no distress, appears stated age  Head:    Normocephalic, without obvious abnormality, atraumatic  Eyes:    PERRL, conjunctiva/corneas clear, EOM's intact, fundi    benign, both eyes  Ears:    Normal TM's and external ear canals, both ears  Nose:   Nares normal, septum midline, mucosa normal, no drainage    or sinus tenderness  Throat:   Lips, mucosa, and tongue normal; teeth and  gums normal  Neck:   Supple, symmetrical, trachea midline, no adenopathy;    thyroid:  no enlargement/tenderness/nodules; no carotid   bruit or JVD  Back:     Symmetric, no curvature, ROM normal, no CVA tenderness  Lungs:     Clear to auscultation bilaterally, respirations unlabored  Chest Wall:    No tenderness or deformity   Heart:    Regular rate and rhythm, S1 and S2 normal, no murmur, rub   or gallop  Breast Exam:    No tenderness, masses, or nipple abnormality  Abdomen:     Soft, non-tender, bowel sounds active all four quadrants,    no masses, no organomegaly  Genitalia:    Normal female without lesion, discharge or tenderness  Extremities:   Extremities normal, atraumatic, no cyanosis or edema  Pulses:   2+ and symmetric all extremities  Skin:   Skin color, texture, turgor normal, no rashes or lesions  Lymph nodes:   Cervical, supraclavicular, and axillary nodes normal  Neurologic:   CNII-XII intact, normal strength, sensation and reflexes    throughout      Lab Review Urine pregnancy test Labs reviewed yes Radiologic studies reviewed no Assessment:    Pregnancy at [redacted]w[redacted]d weeks    Plan:      Prenatal vitamins.  Counseling provided regarding continued use  of seat belts, cessation of alcohol consumption, smoking or use of illicit drugs; infection precautions i.e., influenza/TDAP immunizations, toxoplasmosis,CMV, parvovirus, listeria and varicella; workplace safety, exercise during pregnancy; routine dental care, safe medications, sexual activity, hot tubs, saunas, pools, travel, caffeine use, fish and methlymercury, potential toxins, hair treatments, varicose veins Weight gain recommendations per IOM guidelines reviewed: underweight/BMI< 18.5--> gain 28 - 40 lbs; normal weight/BMI 18.5 - 24.9--> gain 25 - 35 lbs; overweight/BMI 25 - 29.9--> gain 15 - 25 lbs; obese/BMI >30->gain  11 - 20 lbs Problem list reviewed and updated. FIRST/CF mutation testing/NIPT/QUAD SCREEN/fragile  X/Ashkenazi Jewish population testing/Spinal muscular atrophy discussed: ordered. Role of ultrasound in pregnancy discussed; fetal survey: scheduled. Amniocentesis discussed: not indicated. VBAC calculator score: VBAC consent form provided Meds ordered this encounter  Medications  . Prenat-FeAsp-Meth-FA-DHA w/o A (PRENATE PIXIE) 10-0.6-0.4-200 MG CAPS    Sig: Take 1 tablet by mouth daily.    Dispense:  30 capsule    Refill:  12   Orders Placed This Encounter  Procedures  . Culture, OB Urine  . TSH  . Hemoglobinopathy evaluation  . Varicella zoster antibody, IgG  . MaterniT21 PLUS Core+SCA    Order Specific Question:   Is the patient insulin dependent?    Answer:   No    Order Specific Question:   Please enter gestational age. This should be expressed as weeks AND days, i.e. 16w 6d. Enter weeks here. Enter days in next question.    Answer:   24    Order Specific Question:   Please enter gestational age. This should be expressed as weeks AND days, i.e. 16w 6d. Enter days here. Enter weeks in previous question.    Answer:   4    Order Specific Question:   How was gestational age calculated?    Answer:   LMP    Order Specific Question:   Please give the date of LMP OR Ultrasound OR Estimated date of delivery.    Answer:   01/22/2017    Order Specific Question:   Number of Fetuses (Type of Pregnancy):    Answer:   1    Order Specific Question:   Indications for performing the test? (please choose all that apply):    Answer:   Routine screening    Order Specific Question:   Other Indications? (Y=Yes, N=No)    Answer:   Y    Order Specific Question:   Please specify other indications, if any:    Answer:   late to care    Order Specific Question:   If this is a repeat specimen, please indicate the reason:    Answer:   Not indicated    Order Specific Question:   Please specify the patient's race: (C=White/Caucasion, B=Black, I=Native American, A=Asian, H=Hispanic, O=Other, U=Unknown)     Answer:   B    Order Specific Question:   Donor Egg - indicate if the egg was obtained from in vitro fertilization.    Answer:   N    Order Specific Question:   Age of Egg Donor.    Answer:   54    Order Specific Question:   Prior Down Syndrome/ONTD screening during current pregnancy.    Answer:   N    Order Specific Question:   Prior First Trimester Testing    Answer:   N    Order Specific Question:   Prior Second Trimester Testing    Answer:   N    Order Specific Question:  Family History of Neural Tube Defects    Answer:   N    Order Specific Question:   Prior Pregnancy with Down Syndrome    Answer:   N    Order Specific Question:   Please give the patient's weight (in pounds)    Answer:   219  . Hemoglobin A1c  . Obstetric Panel, Including HIV  . Cystic Fibrosis Mutation 97    Follow up in 4 weeks. 50% of 30 min visit spent on counseling and coordination of care.

## 2016-10-01 LAB — CULTURE, OB URINE

## 2016-10-01 LAB — URINE CULTURE, OB REFLEX

## 2016-10-02 ENCOUNTER — Ambulatory Visit (HOSPITAL_COMMUNITY)
Admission: RE | Admit: 2016-10-02 | Discharge: 2016-10-02 | Disposition: A | Discharge status: Home / Self Care | Payer: Medicaid Other | Source: Ambulatory Visit | Attending: Student | Admitting: Student

## 2016-10-02 DIAGNOSIS — O0932 Supervision of pregnancy with insufficient antenatal care, second trimester: Secondary | ICD-10-CM | POA: Insufficient documentation

## 2016-10-02 DIAGNOSIS — Z3689 Encounter for other specified antenatal screening: Secondary | ICD-10-CM | POA: Insufficient documentation

## 2016-10-02 DIAGNOSIS — Z363 Encounter for antenatal screening for malformations: Secondary | ICD-10-CM | POA: Insufficient documentation

## 2016-10-02 DIAGNOSIS — Z3A21 21 weeks gestation of pregnancy: Secondary | ICD-10-CM | POA: Diagnosis not present

## 2016-10-03 ENCOUNTER — Other Ambulatory Visit: Payer: Self-pay | Admitting: Certified Nurse Midwife

## 2016-10-03 DIAGNOSIS — Z283 Underimmunization status: Secondary | ICD-10-CM

## 2016-10-03 DIAGNOSIS — O09899 Supervision of other high risk pregnancies, unspecified trimester: Secondary | ICD-10-CM | POA: Insufficient documentation

## 2016-10-03 DIAGNOSIS — Z3400 Encounter for supervision of normal first pregnancy, unspecified trimester: Secondary | ICD-10-CM

## 2016-10-03 LAB — OBSTETRIC PANEL, INCLUDING HIV
Antibody Screen: NEGATIVE
BASOS ABS: 0 10*3/uL (ref 0.0–0.2)
Basos: 0 %
EOS (ABSOLUTE): 0.1 10*3/uL (ref 0.0–0.4)
Eos: 2 %
HIV SCREEN 4TH GENERATION: NONREACTIVE
Hematocrit: 34.6 % (ref 34.0–46.6)
Hemoglobin: 11.5 g/dL (ref 11.1–15.9)
Hepatitis B Surface Ag: NEGATIVE
IMMATURE GRANULOCYTES: 0 %
Immature Grans (Abs): 0 10*3/uL (ref 0.0–0.1)
LYMPHS ABS: 2.1 10*3/uL (ref 0.7–3.1)
Lymphs: 37 %
MCH: 29.9 pg (ref 26.6–33.0)
MCHC: 33.2 g/dL (ref 31.5–35.7)
MCV: 90 fL (ref 79–97)
MONOS ABS: 0.6 10*3/uL (ref 0.1–0.9)
Monocytes: 10 %
NEUTROS ABS: 2.8 10*3/uL (ref 1.4–7.0)
NEUTROS PCT: 51 %
PLATELETS: 252 10*3/uL (ref 150–379)
RBC: 3.84 x10E6/uL (ref 3.77–5.28)
RDW: 13.8 % (ref 12.3–15.4)
RH TYPE: POSITIVE
RPR Ser Ql: NONREACTIVE
Rubella Antibodies, IGG: 1.05 index (ref >0.99)
WBC: 5.6 10*3/uL (ref 3.4–10.8)

## 2016-10-03 LAB — CYSTIC FIBROSIS MUTATION 97: Interpretation: NOT DETECTED

## 2016-10-03 LAB — TSH: TSH: 1.48 u[IU]/mL (ref 0.450–4.500)

## 2016-10-03 LAB — HEMOGLOBINOPATHY EVALUATION
HEMOGLOBIN A2 QUANTITATION: 2.5 % (ref 1.8–3.2)
HGB C: 0 %
HGB S: 0 %
HGB VARIANT: 0 %
Hemoglobin F Quantitation: 1.4 % (ref 0.0–2.0)
Hgb A: 96.1 % — ABNORMAL LOW (ref 96.4–98.8)

## 2016-10-03 LAB — VARICELLA ZOSTER ANTIBODY, IGG

## 2016-10-03 LAB — HEMOGLOBIN A1C
Est. average glucose Bld gHb Est-mCnc: 94 mg/dL
Hgb A1c MFr Bld: 4.9 % (ref 4.8–5.6)

## 2016-10-06 ENCOUNTER — Other Ambulatory Visit: Payer: Self-pay | Admitting: Certified Nurse Midwife

## 2016-10-06 DIAGNOSIS — Z3400 Encounter for supervision of normal first pregnancy, unspecified trimester: Secondary | ICD-10-CM

## 2016-10-06 LAB — MATERNIT21 PLUS CORE+SCA
CHROMOSOME 13: NEGATIVE
CHROMOSOME 18: NEGATIVE
CHROMOSOME 21: NEGATIVE
Y Chromosome: DETECTED

## 2016-10-08 ENCOUNTER — Other Ambulatory Visit: Payer: Self-pay | Admitting: Certified Nurse Midwife

## 2016-10-08 DIAGNOSIS — Z3400 Encounter for supervision of normal first pregnancy, unspecified trimester: Secondary | ICD-10-CM

## 2016-10-27 ENCOUNTER — Ambulatory Visit (INDEPENDENT_AMBULATORY_CARE_PROVIDER_SITE_OTHER): Payer: Medicaid Other | Admitting: Certified Nurse Midwife

## 2016-10-27 ENCOUNTER — Other Ambulatory Visit: Payer: Medicaid Other

## 2016-10-27 VITALS — BP 113/76 | HR 95 | Wt 230.7 lb

## 2016-10-27 DIAGNOSIS — Z23 Encounter for immunization: Secondary | ICD-10-CM | POA: Diagnosis not present

## 2016-10-27 DIAGNOSIS — O09899 Supervision of other high risk pregnancies, unspecified trimester: Secondary | ICD-10-CM

## 2016-10-27 DIAGNOSIS — Z3402 Encounter for supervision of normal first pregnancy, second trimester: Secondary | ICD-10-CM

## 2016-10-27 DIAGNOSIS — Z2839 Other underimmunization status: Secondary | ICD-10-CM

## 2016-10-27 DIAGNOSIS — Z283 Underimmunization status: Secondary | ICD-10-CM

## 2016-10-27 DIAGNOSIS — Z3400 Encounter for supervision of normal first pregnancy, unspecified trimester: Secondary | ICD-10-CM

## 2016-10-27 MED ORDER — CITRANATAL BLOOM 90-1 MG PO TABS: 1.0000 | ORAL_TABLET | Freq: Every day | ORAL | 12 refills | Status: DC

## 2016-10-27 NOTE — Progress Notes (Signed)
Patient presents for ROB/2 Hr.GTT. TDAP given. Patient tolerated well.

## 2016-10-27 NOTE — Progress Notes (Signed)
   PRENATAL VISIT NOTE  Subjective:  Claudia Moon is a 21 y.o. G1P0 at [redacted]w[redacted]d being seen today for ongoing prenatal care.  She is currently monitored for the following issues for this low-risk pregnancy and has Supervision of normal first pregnancy, antepartum and Maternal varicella, non-immune on her problem list.  Patient reports no complaints.  Contractions: Not present. Vag. Bleeding: None.  Movement: Absent. Denies leaking of fluid.   The following portions of the patient's history were reviewed and updated as appropriate: allergies, current medications, past family history, past medical history, past social history, past surgical history and problem list. Problem list updated.  Objective:   Vitals:   10/27/16 0944  BP: 113/76  Pulse: 95  Weight: 230 lb 11.2 oz (104.6 kg)    Fetal Status: Fetal Heart Rate (bpm): 150 Fundal Height: 28 cm Movement: Absent     General:  Alert, oriented and cooperative. Patient is in no acute distress.  Skin: Skin is warm and dry. No rash noted.   Cardiovascular: Normal heart rate noted  Respiratory: Normal respiratory effort, no problems with respiration noted  Abdomen: Soft, gravid, appropriate for gestational age. Pain/Pressure: Absent     Pelvic:  Cervical exam deferred       Extremities: Normal range of motion.  Edema: Trace  Mental Status: Normal mood and affect. Normal behavior. Normal judgment and thought content.   Assessment and Plan:  Pregnancy: G1P0 at [redacted]w[redacted]d  1. Supervision of normal first pregnancy, antepartum Patient reports doing well. - Glucose Tolerance, 2 Hours w/1 Hour - CBC - HIV antibody (with reflex) - RPR - Tdap vaccine greater than or equal to 7yo IM - Prenatal-DSS-FeCb-FeGl-FA (CITRANATAL BLOOM) 90-1 MG TABS; Take 1 tablet by mouth daily.  Dispense: 30 tablet; Refill: 12  2. Maternal varicella, non-immune Varicella postpartum  Preterm labor symptoms and general obstetric precautions including but not limited to  vaginal bleeding, contractions, leaking of fluid and fetal movement were reviewed in detail with the patient. Please refer to After Visit Summary for other counseling recommendations.  Return in about 2 weeks (around 11/10/2016) for ROB. Patient has f/up US to evaluate anatomy,   Roe Coombs, CNM

## 2016-10-28 ENCOUNTER — Encounter: Payer: Self-pay | Admitting: Certified Nurse Midwife

## 2016-10-28 LAB — CBC
HEMATOCRIT: 32.2 % — AB (ref 34.0–46.6)
HEMOGLOBIN: 10.6 g/dL — AB (ref 11.1–15.9)
MCH: 28.9 pg (ref 26.6–33.0)
MCHC: 32.9 g/dL (ref 31.5–35.7)
MCV: 88 fL (ref 79–97)
Platelets: 229 10*3/uL (ref 150–379)
RBC: 3.67 x10E6/uL — ABNORMAL LOW (ref 3.77–5.28)
RDW: 13.5 % (ref 12.3–15.4)
WBC: 5.6 10*3/uL (ref 3.4–10.8)

## 2016-10-28 LAB — HIV ANTIBODY (ROUTINE TESTING W REFLEX): HIV Screen 4th Generation wRfx: NONREACTIVE

## 2016-10-28 LAB — GLUCOSE TOLERANCE, 2 HOURS W/ 1HR
Glucose, 1 hour: 114 mg/dL (ref 65–179)
Glucose, 2 hour: 116 mg/dL (ref 65–152)
Glucose, Fasting: 83 mg/dL (ref 65–91)

## 2016-10-28 LAB — RPR: RPR: NONREACTIVE

## 2016-10-30 ENCOUNTER — Other Ambulatory Visit: Payer: Self-pay | Admitting: Certified Nurse Midwife

## 2016-10-30 DIAGNOSIS — Z3400 Encounter for supervision of normal first pregnancy, unspecified trimester: Secondary | ICD-10-CM

## 2016-11-07 ENCOUNTER — Ambulatory Visit (HOSPITAL_COMMUNITY)
Admission: RE | Admit: 2016-11-07 | Discharge: 2016-11-07 | Disposition: A | Discharge status: Home / Self Care | Payer: Medicaid Other | Source: Ambulatory Visit | Attending: Certified Nurse Midwife | Admitting: Certified Nurse Midwife

## 2016-11-07 DIAGNOSIS — Z3A29 29 weeks gestation of pregnancy: Secondary | ICD-10-CM | POA: Insufficient documentation

## 2016-11-07 DIAGNOSIS — Z362 Encounter for other antenatal screening follow-up: Secondary | ICD-10-CM | POA: Diagnosis not present

## 2016-11-07 DIAGNOSIS — O0933 Supervision of pregnancy with insufficient antenatal care, third trimester: Secondary | ICD-10-CM | POA: Insufficient documentation

## 2016-11-07 DIAGNOSIS — Z3400 Encounter for supervision of normal first pregnancy, unspecified trimester: Secondary | ICD-10-CM

## 2016-11-09 ENCOUNTER — Other Ambulatory Visit: Payer: Self-pay | Admitting: Certified Nurse Midwife

## 2016-11-09 DIAGNOSIS — Z3400 Encounter for supervision of normal first pregnancy, unspecified trimester: Secondary | ICD-10-CM

## 2016-11-10 ENCOUNTER — Encounter: Payer: Medicaid Other | Admitting: Certified Nurse Midwife

## 2017-05-04 ENCOUNTER — Encounter (HOSPITAL_COMMUNITY): Payer: Self-pay

## 2018-03-02 ENCOUNTER — Emergency Department (HOSPITAL_COMMUNITY)
Admission: EM | Admit: 2018-03-02 | Discharge: 2018-03-02 | Disposition: A | Discharge status: Home / Self Care | Payer: Medicaid Other | Attending: Emergency Medicine | Admitting: Emergency Medicine

## 2018-03-02 ENCOUNTER — Encounter (HOSPITAL_COMMUNITY): Payer: Self-pay | Admitting: *Deleted

## 2018-03-02 DIAGNOSIS — H0011 Chalazion right upper eyelid: Secondary | ICD-10-CM | POA: Diagnosis not present

## 2018-03-02 DIAGNOSIS — H5711 Ocular pain, right eye: Secondary | ICD-10-CM | POA: Diagnosis present

## 2018-03-02 DIAGNOSIS — Z79899 Other long term (current) drug therapy: Secondary | ICD-10-CM | POA: Diagnosis not present

## 2018-03-02 NOTE — Discharge Instructions (Signed)
I have provided the address for Dr. Maryagnes Amos, ophthalmology.  Please arrive to his office at 1 PM on March 03, 2018.  You may continue to apply warm compresses to your right eye.

## 2018-03-02 NOTE — ED Notes (Signed)
Pt verbalized understanding of discharge instructions and denies any further questions at this time.   

## 2018-03-02 NOTE — ED Provider Notes (Signed)
[facility] EMERGENCY DEPARTMENT Provider Note   CSN: [id number] Arrival date & time: 03/02/18  8563     History   Chief Complaint Chief Complaint  Patient presents with  . Eye Problem    HPI Claudia Moon is a 22 y.o. female.  22 y/o female with ano PMH presents to the ED with a chief complaint of right eye stye x 1 week. Patient ports that she has had a stye in her eye her whole life however she is been able to control it with warm compresses but this time the stye will not reduce in size.  She states she was seen by an ophthalmologist years ago and was told they could not do anything for her as " stye is located in a we are place ".  Patient tried warm compresses, over-the-counter antibiotics and states no relieving symptoms.  Reports swelling, redness, green discharge coming from her right eye along with blurry vision.  States she is unable to see out of her right eye as cleared, the image is blurry.  Denies any pain with eye movement, vision lost, or other complaints.     Past Medical History:  Diagnosis Date  . Medical history non-contributory     Patient Active Problem List   Diagnosis Date Noted  . Maternal varicella, non-immune 10/03/2016  . Supervision of normal first pregnancy, antepartum 09/29/2016    Past Surgical History:  Procedure Laterality Date  . NO PAST SURGERIES       OB History    Gravida  1   Para      Term      Preterm      AB      Living  0     SAB      TAB      Ectopic      Multiple      Live Births               Home Medications    Prior to Admission medications   Medication Sig Start Date End Date Taking? Authorizing Provider  acetaminophen (TYLENOL) 500 MG tablet Take 1,000 mg by mouth every 6 (six) hours as needed for mild pain or headache.    [provider]  albuterol (PROVENTIL HFA;VENTOLIN HFA) 108 (90 Base) MCG/ACT inhaler Inhale 2 puffs into the lungs every 6 (six) hours as needed  for wheezing or shortness of breath.    [provider]  nystatin cream (MYCOSTATIN) Apply 1 application topically 2 (two) times daily. Combine with triamcinolone cream & apply thin layer to affected area twice daily 09/28/16   [name], NP  ondansetron (ZOFRAN ODT) 4 MG disintegrating tablet Take 1 tablet (4 mg total) by mouth every 8 (eight) hours as needed for nausea or vomiting. 09/13/16   [name], NP  Prenat-FeAsp-Meth-FA-DHA w/o A (PRENATE PIXIE) 10-0.6-0.4-200 MG CAPS Take 1 tablet by mouth daily. 09/29/16   Roe Coombs, CNM  Prenatal Vit-Fe Fumarate-FA (PRENATAL MULTIVITAMIN) TABS tablet Take 1 tablet by mouth at bedtime.    [provider]  Prenatal-DSS-FeCb-FeGl-FA (CITRANATAL BLOOM) 90-1 MG TABS Take 1 tablet by mouth daily. 10/27/16   Orvilla Cornwall A, CNM  triamcinolone cream (KENALOG) 0.1 % Apply 1 application topically 2 (two) times daily. Combine with nystatin cream & apply thin layer to affected area twice daily 09/28/16   [name], NP    Family History Family History  Problem Relation Age of Onset  . Stroke Mother   .  Diabetes Maternal Grandmother   . Diabetes Maternal Grandfather     Social History Social History   Tobacco Use  . Smoking status: Never Smoker  . Smokeless tobacco: Never Used  Substance Use Topics  . Alcohol use: No  . Drug use: No     Allergies   Patient has no known allergies.   Review of Systems Review of Systems  Constitutional: Negative for fever.  Eyes: Positive for pain, discharge, redness and visual disturbance. Negative for itching.  All other systems reviewed and are negative.    Physical Exam Updated Vital Signs There were no vitals taken for this visit.  Physical Exam  Constitutional: She is oriented to person, place, and time. She appears well-developed and well-nourished.  HENT:  Head: Normocephalic and atraumatic.  Eyes: Pupils are equal, round, and reactive to light. Right eye  exhibits no discharge. Left eye exhibits no discharge. Right conjunctiva is not injected. Left conjunctiva is not injected. No scleral icterus. Right eye exhibits normal extraocular motion. Left eye exhibits normal extraocular motion. Right pupil is round and reactive. Left pupil is reactive. Pupils are equal.    1 cm nontender stye to the inner upper eyelid.  Pain with eye movement.  Does report difficulty seeing out of right eye, no vision lost.  Tono-Pen pressure 16  Neck: Normal range of motion. Neck supple.  Cardiovascular: Normal heart sounds.  Pulmonary/Chest: Breath sounds normal.  Abdominal: Soft.  Musculoskeletal: She exhibits no tenderness or deformity.  Neurological: She is alert and oriented to person, place, and time.  Skin: Skin is warm and dry.  Nursing note and vitals reviewed.          Visual Acuity  Right Eye Distance: 20/32 Left Eye Distance: 20/25 Bilateral Distance: 20/20  Right Eye Near:   Left Eye Near:    Bilateral Near:      ED Treatments / Results  Labs (all labs ordered are listed, but only abnormal results are displayed) Labs Reviewed - No data to display  EKG None  Radiology No results found.  Procedures Procedures (including critical care time)  Medications Ordered in ED Medications - No data to display   Initial Impression / Assessment and Plan / ED Course  I have reviewed the triage vital signs and the nursing notes.  Pertinent labs & imaging results that were available during my care of the patient were reviewed by me and considered in my medical decision making (see chart for details).     She presents with right 1 cm chalazion centimeters under inner upper lid.  She reports this is been an ongoing complaint for her for multiple years however this time is affecting her vision and she is unable to reduce the swelling by warm compresses.  I have placed a call to Dr. Sherrine Maples who is currently in surgery.  11:30 AM to Dr. Sherrine Maples  who recommended patient be seen as an outpatient tomorrow in his office at 1 PM.  He recommended patient keep applying warm compresses to her eye couple times a day.  Patient instructed on these recommendations, she requested a work note for today and tomorrow I will provide these for patients.  Patient stable for discharge.  Return precautions provided.  Final Clinical Impressions(s) / ED Diagnoses   Final diagnoses:  Chalazion of right upper eyelid    ED Discharge Orders    None       Claude Manges, Cordelia Poche 03/02/18 1136    Wynetta Fines, MD 03/04/18  1115  

## 2018-03-02 NOTE — ED Triage Notes (Signed)
Pt in c/o redness and swelling to her right upper eyelid, states she has had a stye there for a long time and it recently became inflamed, also reports matting to that eye when she wakes up

## 2018-03-17 ENCOUNTER — Encounter (HOSPITAL_COMMUNITY): Payer: Self-pay

## 2018-03-17 ENCOUNTER — Emergency Department (HOSPITAL_COMMUNITY)
Admission: EM | Admit: 2018-03-17 | Discharge: 2018-03-18 | Payer: Medicaid Other | Attending: Emergency Medicine | Admitting: Emergency Medicine

## 2018-03-17 ENCOUNTER — Other Ambulatory Visit: Payer: Self-pay

## 2018-03-17 DIAGNOSIS — R52 Pain, unspecified: Secondary | ICD-10-CM | POA: Diagnosis not present

## 2018-03-17 DIAGNOSIS — Z79899 Other long term (current) drug therapy: Secondary | ICD-10-CM | POA: Diagnosis not present

## 2018-03-17 DIAGNOSIS — Y999 Unspecified external cause status: Secondary | ICD-10-CM | POA: Insufficient documentation

## 2018-03-17 DIAGNOSIS — S0282XA Fracture of other specified skull and facial bones, left side, initial encounter for closed fracture: Secondary | ICD-10-CM | POA: Diagnosis not present

## 2018-03-17 DIAGNOSIS — Y9389 Activity, other specified: Secondary | ICD-10-CM | POA: Insufficient documentation

## 2018-03-17 DIAGNOSIS — S0280XA Fracture of other specified skull and facial bones, unspecified side, initial encounter for closed fracture: Secondary | ICD-10-CM

## 2018-03-17 DIAGNOSIS — H5710 Ocular pain, unspecified eye: Secondary | ICD-10-CM | POA: Diagnosis not present

## 2018-03-17 DIAGNOSIS — Y9289 Other specified places as the place of occurrence of the external cause: Secondary | ICD-10-CM | POA: Insufficient documentation

## 2018-03-17 DIAGNOSIS — R609 Edema, unspecified: Secondary | ICD-10-CM | POA: Diagnosis not present

## 2018-03-17 DIAGNOSIS — S0232XA Fracture of orbital floor, left side, initial encounter for closed fracture: Secondary | ICD-10-CM | POA: Diagnosis not present

## 2018-03-17 DIAGNOSIS — S0990XA Unspecified injury of head, initial encounter: Secondary | ICD-10-CM | POA: Diagnosis not present

## 2018-03-17 DIAGNOSIS — S0285XA Fracture of orbit, unspecified, initial encounter for closed fracture: Secondary | ICD-10-CM
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
## 2018-03-17 NOTE — ED Notes (Signed)
Bed: WTR7 Expected date:  Expected time:  Means of arrival:  Comments: 

## 2018-03-18 ENCOUNTER — Emergency Department (HOSPITAL_COMMUNITY): Payer: Medicaid Other

## 2018-03-18 DIAGNOSIS — S0232XA Fracture of orbital floor, left side, initial encounter for closed fracture: Secondary | ICD-10-CM | POA: Diagnosis not present

## 2018-03-18 MED ORDER — ACETAMINOPHEN 325 MG PO TABS
650.0000 mg | ORAL_TABLET | Freq: Once | ORAL | Status: AC
  Administered 2018-03-18: 650 mg via ORAL
  Filled 2018-03-18: qty 2

## 2018-03-18 MED ORDER — AMOXICILLIN-POT CLAVULANATE 875-125 MG PO TABS: 1.0000 | ORAL_TABLET | Freq: Two times a day (BID) | ORAL | 0 refills | Status: DC

## 2018-03-18 MED ORDER — HYDROCODONE-ACETAMINOPHEN 5-325 MG PO TABS: 1.0000 | ORAL_TABLET | Freq: Four times a day (QID) | ORAL | 0 refills | Status: DC | PRN

## 2018-03-18 MED ORDER — NAPROXEN 500 MG PO TABS: 500.0000 mg | ORAL_TABLET | Freq: Two times a day (BID) | ORAL | 0 refills | Status: DC

## 2018-03-18 MED ORDER — OXYCODONE HCL 5 MG PO TABS
5.0000 mg | ORAL_TABLET | Freq: Once | ORAL | Status: AC
  Administered 2018-03-18: 5 mg via ORAL
  Filled 2018-03-18: qty 1

## 2018-03-18 NOTE — Discharge Instructions (Signed)
It was my pleasure taking care of you today!   Please call the ENT (ear, nose and throat) doctor in the morning to schedule a follow-up appointment.  Please take all of your antibiotics until finished!  This is to prevent you from getting an infection in your sinus where the bone is broken.  Naproxen as needed for mild to moderate pain. Take your Norco only as needed for severe pain - This can make you very drowsy - please do not drink alcohol, operate heavy machinery or drive on this medication  Return to ER for new or worsening symptoms, any additional concerns.
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
## 2018-03-22 DIAGNOSIS — S0282XA Fracture of other specified skull and facial bones, left side, initial encounter for closed fracture: Secondary | ICD-10-CM | POA: Diagnosis not present

## 2018-04-03 ENCOUNTER — Emergency Department (HOSPITAL_COMMUNITY)
Admission: EM | Admit: 2018-04-03 | Discharge: 2018-04-03 | Disposition: A | Discharge status: Home / Self Care | Payer: Medicaid Other | Attending: Emergency Medicine | Admitting: Emergency Medicine

## 2018-04-03 ENCOUNTER — Other Ambulatory Visit: Payer: Self-pay

## 2018-04-03 ENCOUNTER — Encounter (HOSPITAL_COMMUNITY): Payer: Self-pay | Admitting: *Deleted

## 2018-04-03 DIAGNOSIS — N12 Tubulo-interstitial nephritis, not specified as acute or chronic: Secondary | ICD-10-CM | POA: Diagnosis not present

## 2018-04-03 DIAGNOSIS — R319 Hematuria, unspecified: Secondary | ICD-10-CM | POA: Insufficient documentation

## 2018-04-03 DIAGNOSIS — R1110 Vomiting, unspecified: Secondary | ICD-10-CM | POA: Insufficient documentation

## 2018-04-03 DIAGNOSIS — R109 Unspecified abdominal pain: Secondary | ICD-10-CM | POA: Diagnosis present

## 2018-04-03 DIAGNOSIS — R1032 Left lower quadrant pain: Secondary | ICD-10-CM | POA: Diagnosis not present

## 2018-04-03 LAB — COMPREHENSIVE METABOLIC PANEL
ALK PHOS: 85 U/L (ref 38–126)
ALT: 21 U/L (ref 0–44)
AST: 21 U/L (ref 15–41)
Albumin: 3.8 g/dL (ref 3.5–5.0)
Anion gap: 11 (ref 5–15)
BILIRUBIN TOTAL: 1 mg/dL (ref 0.3–1.2)
BUN: 8 mg/dL (ref 6–20)
CALCIUM: 8.7 mg/dL — AB (ref 8.9–10.3)
CHLORIDE: 98 mmol/L (ref 98–111)
CO2: 22 mmol/L (ref 22–32)
CREATININE: 0.98 mg/dL (ref 0.44–1.00)
Glucose, Bld: 88 mg/dL (ref 70–99)
Potassium: 3.3 mmol/L — ABNORMAL LOW (ref 3.5–5.1)
Sodium: 131 mmol/L — ABNORMAL LOW (ref 135–145)
TOTAL PROTEIN: 8.1 g/dL (ref 6.5–8.1)

## 2018-04-03 LAB — CBC
HCT: 44.5 % (ref 36.0–46.0)
Hemoglobin: 14.5 g/dL (ref 12.0–15.0)
MCH: 29.1 pg (ref 26.0–34.0)
MCHC: 32.6 g/dL (ref 30.0–36.0)
MCV: 89.2 fL (ref 78.0–100.0)
PLATELETS: 240 10*3/uL (ref 150–400)
RBC: 4.99 MIL/uL (ref 3.87–5.11)
RDW: 13.4 % (ref 11.5–15.5)
WBC: 14.5 10*3/uL — AB (ref 4.0–10.5)

## 2018-04-03 LAB — I-STAT BETA HCG BLOOD, ED (MC, WL, AP ONLY): I-stat hCG, quantitative: <5 m[IU]/mL (ref <5)

## 2018-04-03 LAB — URINALYSIS, ROUTINE W REFLEX MICROSCOPIC
BILIRUBIN URINE: NEGATIVE
GLUCOSE, UA: NEGATIVE mg/dL
KETONES UR: 80 mg/dL — AB
NITRITE: POSITIVE — AB
PH: 6 (ref 5.0–8.0)
Protein, ur: NEGATIVE mg/dL
SPECIFIC GRAVITY, URINE: 1.016 (ref 1.005–1.030)
WBC, UA: >50 WBC/hpf — ABNORMAL HIGH (ref 0–5)

## 2018-04-03 LAB — LIPASE, BLOOD: LIPASE: 20 U/L (ref 11–51)

## 2018-04-03 LAB — I-STAT CG4 LACTIC ACID, ED: Lactic Acid, Venous: 1.27 mmol/L (ref 0.5–1.9)

## 2018-04-03 MED ORDER — ONDANSETRON HCL 4 MG PO TABS: 4.0000 mg | ORAL_TABLET | Freq: Three times a day (TID) | ORAL | 0 refills | Status: DC | PRN

## 2018-04-03 MED ORDER — IBUPROFEN 800 MG PO TABS
800.0000 mg | ORAL_TABLET | Freq: Once | ORAL | Status: AC
  Administered 2018-04-03: 800 mg via ORAL
  Filled 2018-04-03: qty 1

## 2018-04-03 MED ORDER — SODIUM CHLORIDE 0.9 % IV BOLUS
1000.0000 mL | Freq: Once | INTRAVENOUS | Status: AC
  Administered 2018-04-03: 1000 mL via INTRAVENOUS

## 2018-04-03 MED ORDER — CEFTRIAXONE SODIUM 1 G IJ SOLR
1.0000 g | INTRAMUSCULAR | Status: DC
  Administered 2018-04-03: 1 g via INTRAVENOUS
  Filled 2018-04-03: qty 10

## 2018-04-03 MED ORDER — CEPHALEXIN 500 MG PO CAPS: ORAL_CAPSULE | ORAL | 0 refills | Status: DC

## 2018-04-03 MED ORDER — ACETAMINOPHEN 500 MG PO TABS
1000.0000 mg | ORAL_TABLET | Freq: Once | ORAL | Status: AC
  Administered 2018-04-03: 1000 mg via ORAL
  Filled 2018-04-03: qty 2

## 2018-04-03 NOTE — Discharge Instructions (Signed)
Make sure to drink plenty of fluids, treat your fever with Motrin and Tylenol.  You can alternate between these 2 medications.  Return to the emergency department for the symptoms listed below.  Contact a health care provider if: Your symptoms do not get better after 2 days of treatment. Your symptoms get worse. You have a fever. Get help right away if: You are unable to take your antibiotics or fluids. You have shaking chills. You vomit. You have severe flank or back pain. You have extreme weakness or fainting.
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
## 2018-04-03 NOTE — ED Notes (Signed)
Pt stable, ambulatory, states understanding of discharge instructions 

## 2018-04-05 LAB — URINE CULTURE: Culture: >=100000 — AB

## 2018-04-06 ENCOUNTER — Telehealth: Payer: Self-pay | Admitting: Emergency Medicine

## 2018-04-06 NOTE — Telephone Encounter (Signed)
Post ED Visit - Positive Culture Follow-up: Successful Patient Follow-Up  Culture assessed and recommendations reviewed by:  []  Enzo Bi, Pharm.D. []  Celedonio Miyamoto, Pharm.D., BCPS AQ-ID []  Garvin Fila, Pharm.D., BCPS []  Georgina Pillion, Pharm.D., BCPS []  [place], [place].D., BCPS, AAHIVP []  Estella Husk, Pharm.D., BCPS, AAHIVP []  Lysle Pearl, PharmD, BCPS []  Phillips Climes, PharmD, BCPS []  Agapito Games, PharmD, BCPS []  Verlan Friends, PharmD  Positive urine culture  []  Patient discharged without antimicrobial prescription and treatment is now indicated [x]  Organism is resistant to prescribed ED discharge antimicrobial []  Patient with positive blood cultures  Changes discussed with ED provider: Harlene Salts PA New antibiotic prescription stop cephalexin, start ciprofloxacin 500mg  po q 12 hours x 5 days Called to [facility]  Contacted patient, 04/06/2018 1151   Berle Mull 04/06/2018, 11:49 AM

## 2018-04-06 NOTE — Progress Notes (Signed)
ED Antimicrobial Stewardship Positive Culture Follow Up   Claudia Moon is an 22 y.o. female who presented to [facility] on 04/03/2018 with a chief complaint of  Chief Complaint  Patient presents with  . Abdominal Pain  . Back Pain    Recent Results (from the past 720 hour(s))  Urine Culture     Status: Abnormal   Collection Time: 04/03/18 10:54 AM  Result Value Ref Range Status   Specimen Description URINE, CLEAN CATCH  Final   Special Requests   Final    NONE Performed at [facility], [address], [place], [place] [address]    Culture (A)  Final    >=100,000 COLONIES/mL ESCHERICHIA COLI Confirmed Extended Spectrum Beta-Lactamase Producer (ESBL).  In bloodstream infections from ESBL organisms, carbapenems are preferred over piperacillin/tazobactam. They are shown to have a lower risk of mortality.    Report Status 04/05/2018 FINAL  Final   Organism ID, Bacteria ESCHERICHIA COLI (A)  Final      Susceptibility   Escherichia coli - MIC*    AMPICILLIN >=32 RESISTANT Resistant     CEFAZOLIN >=64 RESISTANT Resistant     CEFTRIAXONE >=64 RESISTANT Resistant     CIPROFLOXACIN <=0.25 SENSITIVE Sensitive     GENTAMICIN >=16 RESISTANT Resistant     IMIPENEM <=0.25 SENSITIVE Sensitive     NITROFURANTOIN <=16 SENSITIVE Sensitive     TRIMETH/SULFA >=320 RESISTANT Resistant     AMPICILLIN/SULBACTAM 16 INTERMEDIATE Intermediate     PIP/TAZO <=4 SENSITIVE Sensitive     Extended ESBL POSITIVE Resistant     * >=100,000 COLONIES/mL ESCHERICHIA COLI    [x]  Treated with cephalexin 1000 mg BID, organism resistant to prescribed antimicrobial []  Patient discharged originally without antimicrobial agent and treatment is now indicated  New antibiotic prescription: ciprofloxacin 500 mg PO BID x 5 days   ED Provider: Harlene Salts PA-C   [name] 04/06/2018, 10:23 AM Pharmacy Student  Monday - Friday phone -  [phone] Saturday - Sunday phone - [phone]

## 2018-06-28 ENCOUNTER — Inpatient Hospital Stay (HOSPITAL_COMMUNITY)
Admission: AD | Admit: 2018-06-28 | Discharge: 2018-06-28 | Disposition: A | Discharge status: Home / Self Care | Payer: Medicaid Other | Attending: Obstetrics & Gynecology | Admitting: Obstetrics & Gynecology

## 2018-06-28 ENCOUNTER — Encounter (HOSPITAL_COMMUNITY): Payer: Self-pay | Admitting: *Deleted

## 2018-06-28 DIAGNOSIS — O34219 Maternal care for unspecified type scar from previous cesarean delivery: Secondary | ICD-10-CM | POA: Diagnosis not present

## 2018-06-28 DIAGNOSIS — Z791 Long term (current) use of non-steroidal anti-inflammatories (NSAID): Secondary | ICD-10-CM | POA: Diagnosis not present

## 2018-06-28 DIAGNOSIS — O26891 Other specified pregnancy related conditions, first trimester: Secondary | ICD-10-CM | POA: Diagnosis not present

## 2018-06-28 DIAGNOSIS — O219 Vomiting of pregnancy, unspecified: Secondary | ICD-10-CM | POA: Diagnosis not present

## 2018-06-28 DIAGNOSIS — Z79899 Other long term (current) drug therapy: Secondary | ICD-10-CM | POA: Diagnosis not present

## 2018-06-28 DIAGNOSIS — Z3A12 12 weeks gestation of pregnancy: Secondary | ICD-10-CM | POA: Diagnosis not present

## 2018-06-28 DIAGNOSIS — Z833 Family history of diabetes mellitus: Secondary | ICD-10-CM | POA: Insufficient documentation

## 2018-06-28 LAB — URINALYSIS, ROUTINE W REFLEX MICROSCOPIC
Bilirubin Urine: NEGATIVE
Glucose, UA: NEGATIVE mg/dL
Hgb urine dipstick: NEGATIVE
KETONES UR: 20 mg/dL — AB
Nitrite: NEGATIVE
Protein, ur: 30 mg/dL — AB
SPECIFIC GRAVITY, URINE: 1.021 (ref 1.005–1.030)
pH: 8 (ref 5.0–8.0)

## 2018-06-28 MED ORDER — SODIUM CHLORIDE 0.9 % IV SOLN
8.0000 mg | Freq: Once | INTRAVENOUS | Status: AC
  Administered 2018-06-28: 8 mg via INTRAVENOUS
  Filled 2018-06-28: qty 4

## 2018-06-28 MED ORDER — FAMOTIDINE IN NACL 20-0.9 MG/50ML-% IV SOLN
20.0000 mg | Freq: Once | INTRAVENOUS | Status: AC
  Administered 2018-06-28: 20 mg via INTRAVENOUS
  Filled 2018-06-28: qty 50

## 2018-06-28 MED ORDER — DEXTROSE 5 % IN LACTATED RINGERS IV BOLUS
1000.0000 mL | Freq: Once | INTRAVENOUS | Status: AC
  Administered 2018-06-28: 1000 mL via INTRAVENOUS

## 2018-06-28 MED ORDER — ONDANSETRON 8 MG PO TBDP: 8.0000 mg | ORAL_TABLET | Freq: Three times a day (TID) | ORAL | 0 refills | Status: DC | PRN

## 2018-06-28 MED ORDER — PROMETHAZINE HCL 12.5 MG PO TABS: 12.5000 mg | ORAL_TABLET | Freq: Every evening | ORAL | 0 refills | Status: DC | PRN

## 2018-06-28 NOTE — MAU Note (Signed)
U/A sent, not enough urine for culture tube

## 2018-06-28 NOTE — MAU Note (Signed)
Pt reports vomiting continues with pregnancy but has not been able to keep anything down since yesterday morning. Some dizziness.

## 2018-06-28 NOTE — MAU Provider Note (Signed)
History     CSN: [id number]  Arrival date and time: 06/28/18 1202   First Provider Initiated Contact with Patient 06/28/18 1333      Chief Complaint  Patient presents with  . Emesis  . Nausea  . Dizziness   HPI   Claudia Moon is a 22 y.o. female G2P1 @ 8130w5d here with nausea and vomiting X 2 days. The symptoms have worsened over the last 2 days. She is unable to keep down water. She has not tried any medications for the symptoms. She denies abdominal pain or vaginal bleeding.   OB History    Gravida  2   Para  1   Term      Preterm      AB      Living  1     SAB      TAB      Ectopic      Multiple      Live Births              Past Medical History:  Diagnosis Date  . Medical history non-contributory     Past Surgical History:  Procedure Laterality Date  . CESAREAN SECTION    [facility]      Family History  Problem Relation Age of Onset  . Stroke Mother   . Diabetes Maternal Grandmother   . Diabetes Maternal Grandfather     Social History   Tobacco Use  . Smoking status: Never Smoker  . Smokeless tobacco: Never Used  Substance Use Topics  . Alcohol use: No  . Drug use: No    Allergies: No Known Allergies  Medications Prior to Admission  Medication Sig Dispense Refill Last Dose  . acetaminophen (TYLENOL) 500 MG tablet Take 1,000 mg by mouth every 6 (six) hours as needed for mild pain or headache.   Taking  . albuterol (PROVENTIL HFA;VENTOLIN HFA) 108 (90 Base) MCG/ACT inhaler Inhale 2 puffs into the lungs every 6 (six) hours as needed for wheezing or shortness of breath.   Taking  . amoxicillin-clavulanate (AUGMENTIN) 875-125 MG tablet Take 1 tablet by mouth every 12 (twelve) hours. 14 tablet 0   . cephALEXin (KEFLEX) 500 MG capsule 2 caps po bid x 7 days 28 capsule 0   . HYDROcodone-acetaminophen (NORCO/VICODIN) 5-325 MG tablet Take 1 tablet by mouth every 6 (six) hours as needed for severe pain. 8 tablet 0    . naproxen (NAPROSYN) 500 MG tablet Take 1 tablet (500 mg total) by mouth 2 (two) times daily. 30 tablet 0   . nystatin cream (MYCOSTATIN) Apply 1 application topically 2 (two) times daily. Combine with triamcinolone cream & apply thin layer to affected area twice daily 15 g 0 Taking  . ondansetron (ZOFRAN ODT) 4 MG disintegrating tablet Take 1 tablet (4 mg total) by mouth every 8 (eight) hours as needed for nausea or vomiting. 15 tablet 0 Taking  . ondansetron (ZOFRAN) 4 MG tablet Take 1 tablet (4 mg total) by mouth every 8 (eight) hours as needed for nausea or vomiting. 10 tablet 0   . Prenat-FeAsp-Meth-FA-DHA w/o A (PRENATE PIXIE) 10-0.6-0.4-200 MG CAPS Take 1 tablet by mouth daily. 30 capsule 12   . Prenatal Vit-Fe Fumarate-FA (PRENATAL MULTIVITAMIN) TABS tablet Take 1 tablet by mouth at bedtime.   Taking  . Prenatal-DSS-FeCb-FeGl-FA (CITRANATAL BLOOM) 90-1 MG TABS Take 1 tablet by mouth daily. 30 tablet 12   . triamcinolone cream (KENALOG) 0.1 % Apply 1 application  topically 2 (two) times daily. Combine with nystatin cream & apply thin layer to affected area twice daily 15 g 0 Taking   Results for orders placed or performed during the hospital encounter of 06/28/18 (from the past 48 hour(s))  Urinalysis, Routine w reflex microscopic     Status: Abnormal   Collection Time: 06/28/18 12:39 PM  Result Value Ref Range   Color, Urine YELLOW YELLOW   APPearance CLOUDY (A) CLEAR   Specific Gravity, Urine 1.021 1.005 - 1.030   pH 8.0 5.0 - 8.0   Glucose, UA NEGATIVE NEGATIVE mg/dL   Hgb urine dipstick NEGATIVE NEGATIVE   Bilirubin Urine NEGATIVE NEGATIVE   Ketones, ur 20 (A) NEGATIVE mg/dL   Protein, ur 30 (A) NEGATIVE mg/dL   Nitrite NEGATIVE NEGATIVE   Leukocytes, UA SMALL (A) NEGATIVE   RBC / HPF 0-5 0 - 5 RBC/hpf   WBC, UA 6-10 0 - 5 WBC/hpf   Bacteria, UA RARE (A) NONE SEEN   Squamous Epithelial / LPF 21-50 0 - 5   Mucus PRESENT    Amorphous Crystal PRESENT     Comment: Performed at  [facility], [address] [place], [place] [address]   Review of Systems  Gastrointestinal: Positive for nausea and vomiting.  Neurological: Positive for dizziness.   Physical Exam   Blood pressure 114/69, pulse 79, temperature 98.4 F (36.9 C), temperature source Oral, resp. rate 16, height 5\' 8"  (1.727 m), weight 112 kg, last menstrual period 03/31/2018, SpO2 98 %, unknown if currently breastfeeding.  Physical Exam  Constitutional: She is oriented to person, place, and time. She appears well-developed and well-nourished. No distress.  HENT:  Head: Normocephalic.  Musculoskeletal: Normal range of motion.  Neurological: She is alert and oriented to person, place, and time.  Skin: Skin is warm. She is not diaphoretic.  Psychiatric: Her behavior is normal.   MAU Course  Procedures  None  MDM  Urine shows Ketones > 20 D5LR bolus X 1 Pepcid 20 mg IV Zofran 8 mg IV Patient tolerated oral fluids + fetal heart tones via doppler   Assessment and Plan   A:  1. Nausea and vomiting in pregnancy     P:  Discharge home in stable condition Rx: Phenergan, Zofran Return to MAU if symptoms worsen Small, frequent meals BRAT diet   Rasch, Harolyn RutherfordJennifer I, NP 06/28/2018 5:09 PM

## 2018-06-28 NOTE — Discharge Instructions (Signed)
Morning Sickness    Morning sickness is when you feel sick to your stomach (nauseous) during pregnancy. You may feel sick to your stomach and throw up (vomit). You may feel sick in the morning, but you can feel this way at any time of day. Some women feel very sick to their stomach and cannot stop throwing up (hyperemesis gravidarum).  Follow these instructions at home:  Medicines   Take over-the-counter and prescription medicines only as told by your doctor. Do not take any medicines until you talk with your doctor about them first.   Taking multivitamins before getting pregnant can stop or lessen the harshness of morning sickness.  Eating and drinking   Eat dry toast or crackers before getting out of bed.   Eat 5 or 6 small meals a day.   Eat dry and bland foods like rice and baked potatoes.   Do not eat greasy, fatty, or spicy foods.   Have someone cook for you if the smell of food causes you to feel sick or throw up.   If you feel sick to your stomach after taking prenatal vitamins, take them at night or with a snack.   Eat protein when you need a snack. Nuts, yogurt, and cheese are good choices.   Drink fluids throughout the day.   Try ginger ale made with real ginger, ginger tea made from fresh grated ginger, or ginger candies.  General instructions   Do not use any products that have nicotine or tobacco in them, such as cigarettes and e-cigarettes. If you need help quitting, ask your doctor.   Use an air purifier to keep the air in your house free of smells.   Get lots of fresh air.   Try to avoid smells that make you feel sick.   Try:  ? Wearing a bracelet that is used for seasickness (acupressure wristband).  ? Going to a doctor who puts thin needles into certain body points (acupuncture) to improve how you feel.  Contact a doctor if:   You need medicine to feel better.   You feel dizzy or light-headed.   You are losing weight.  Get help right away if:   You feel very sick to your  stomach and cannot stop throwing up.   You pass out (faint).   You have very bad pain in your belly.  Summary   Morning sickness is when you feel sick to your stomach (nauseous) during pregnancy.   You may feel sick in the morning, but you can feel this way at any time of day.   Making some changes to what you eat may help your symptoms go away.  This information is not intended to replace advice given to you by your health care provider. Make sure you discuss any questions you have with your health care provider.  Document Released: 07/24/2004 Document Revised: 07/17/2016 Document Reviewed: 07/17/2016  Elsevier Interactive Patient Education  2019 Elsevier Inc[employer]

## 2018-06-30 HISTORY — DX: Maternal care for unspecified type scar from previous cesarean delivery: O34.219

## 2018-06-30 NOTE — L&D Delivery Note (Signed)
Delivery Note At 34 a viable female infant was delivered via SVD, presentation: OA. APGAR: 9, 9; weight pending.   Placenta status: spontaneously delivered intact with gentle cord traction. Fundus firm with massage and Pitocin.   Anesthesia: epidural Lacerations: bilateral labial, right-hemostatic, left-repaired Suture used for repair: 3-0 Vicryl rapide Est. Blood Loss (mL): 200 Placenta to path d/t malodorous fluid Complications none Cord ph n/a   Mom to postpartum. Baby to Couplet care / Skin to Skin.    Julianne Handler, CNM 12/23/2018 3:33 PM

## 2018-08-12 ENCOUNTER — Encounter: Payer: Self-pay | Admitting: *Deleted

## 2018-08-12 ENCOUNTER — Other Ambulatory Visit (HOSPITAL_COMMUNITY)
Admission: RE | Admit: 2018-08-12 | Discharge: 2018-08-12 | Disposition: A | Discharge status: Home / Self Care | Payer: Medicaid Other | Source: Ambulatory Visit | Attending: Certified Nurse Midwife | Admitting: Certified Nurse Midwife

## 2018-08-12 ENCOUNTER — Ambulatory Visit (INDEPENDENT_AMBULATORY_CARE_PROVIDER_SITE_OTHER): Payer: Medicaid Other | Admitting: Certified Nurse Midwife

## 2018-08-12 ENCOUNTER — Encounter: Payer: Self-pay | Admitting: Certified Nurse Midwife

## 2018-08-12 VITALS — Wt 246.0 lb

## 2018-08-12 DIAGNOSIS — O0932 Supervision of pregnancy with insufficient antenatal care, second trimester: Secondary | ICD-10-CM

## 2018-08-12 DIAGNOSIS — Z3490 Encounter for supervision of normal pregnancy, unspecified, unspecified trimester: Secondary | ICD-10-CM | POA: Diagnosis not present

## 2018-08-12 DIAGNOSIS — O34219 Maternal care for unspecified type scar from previous cesarean delivery: Secondary | ICD-10-CM

## 2018-08-12 DIAGNOSIS — Z3492 Encounter for supervision of normal pregnancy, unspecified, second trimester: Secondary | ICD-10-CM

## 2018-08-12 DIAGNOSIS — O99210 Obesity complicating pregnancy, unspecified trimester: Secondary | ICD-10-CM | POA: Diagnosis not present

## 2018-08-12 DIAGNOSIS — O99212 Obesity complicating pregnancy, second trimester: Secondary | ICD-10-CM | POA: Diagnosis not present

## 2018-08-12 DIAGNOSIS — Z3482 Encounter for supervision of other normal pregnancy, second trimester: Secondary | ICD-10-CM | POA: Diagnosis not present

## 2018-08-12 DIAGNOSIS — O219 Vomiting of pregnancy, unspecified: Secondary | ICD-10-CM

## 2018-08-12 DIAGNOSIS — Z23 Encounter for immunization: Secondary | ICD-10-CM | POA: Diagnosis not present

## 2018-08-12 HISTORY — DX: Supervision of pregnancy with insufficient antenatal care, second trimester: O09.32

## 2018-08-12 HISTORY — DX: Encounter for supervision of normal pregnancy, unspecified, unspecified trimester: Z34.90

## 2018-08-12 MED ORDER — VITAFOL GUMMIES 3.33-0.333-34.8 MG PO CHEW: 3.0000 | CHEWABLE_TABLET | Freq: Every day | ORAL | 3 refills | Status: DC

## 2018-08-12 MED ORDER — ONDANSETRON 8 MG PO TBDP: 8.0000 mg | ORAL_TABLET | Freq: Three times a day (TID) | ORAL | 1 refills | Status: DC | PRN

## 2018-08-12 MED ORDER — ASPIRIN EC 81 MG PO TBEC: 81.0000 mg | DELAYED_RELEASE_TABLET | Freq: Every day | ORAL | 0 refills | Status: DC

## 2018-08-12 NOTE — Patient Instructions (Signed)

## 2018-08-12 NOTE — Progress Notes (Signed)
Subjective:    Claudia Moon is a 23 y.o. G2P0101 at [redacted]w[redacted]d by LMP being seen today for her first obstetrical visit.  Her obstetrical history is significant for obesity and previous cesarean section. Patient does intend to breast feed. Pregnancy history fully reviewed.  Patient reports nausea and vomiting.    Objective  HISTORY: OB History  Gravida Para Term Preterm AB Living  2 1 0 1 0 1  SAB TAB Ectopic Multiple Live Births  0 0 0 0 1    # Outcome Date GA Lbr Len/2nd Weight Sex Delivery Anes PTL Lv  2 Current           1 Preterm  [redacted]w[redacted]d   M CS-LTranv   LIV     Complications: Failure to Progress in First Stage, Chorioamnionitis    She has never had a pap smear   Past Medical History:  Diagnosis Date  . Medical history non-contributory    Past Surgical History:  Procedure Laterality Date  . CESAREAN SECTION    . multiple surgery from [facility]     Family History  Problem Relation Age of Onset  . Stroke Mother   . Diabetes Maternal Grandmother   . Diabetes Maternal Grandfather    Social History   Tobacco Use  . Smoking status: Never Smoker  . Smokeless tobacco: Never Used  Substance Use Topics  . Alcohol use: No  . Drug use: No   No Known Allergies Current Outpatient Medications on File Prior to Visit  Medication Sig Dispense Refill  . acetaminophen (TYLENOL) 500 MG tablet Take 1,000 mg by mouth every 6 (six) hours as needed for mild pain or headache.    . albuterol (PROVENTIL HFA;VENTOLIN HFA) 108 (90 Base) MCG/ACT inhaler Inhale 2 puffs into the lungs every 6 (six) hours as needed for wheezing or shortness of breath.    . nystatin cream (MYCOSTATIN) Apply 1 application topically 2 (two) times daily. Combine with triamcinolone cream & apply thin layer to affected area twice daily (Patient not taking: Reported on 06/28/2018) 15 g 0  . ondansetron (ZOFRAN ODT) 8 MG disintegrating tablet Take 1 tablet (8 mg total) by mouth every 8 (eight) hours as needed for nausea or  vomiting. 20 tablet 0  . Prenatal Vit-Fe Fumarate-FA (PRENATAL MULTIVITAMIN) TABS tablet Take 1 tablet by mouth at bedtime.    . Prenatal-DSS-FeCb-FeGl-FA (CITRANATAL BLOOM) 90-1 MG TABS Take 1 tablet by mouth daily. 30 tablet 12  . promethazine (PHENERGAN) 12.5 MG tablet Take 1 tablet (12.5 mg total) by mouth at bedtime as needed for nausea or vomiting. 30 tablet 0   No current facility-administered medications on file prior to visit.     Review of Systems Pertinent items noted in HPI and remainder of comprehensive ROS otherwise negative.  Exam   Vitals:   08/12/18 1310  Weight: 246 lb (111.6 kg)   Fetal Heart Rate (bpm): 160  Uterus:  Fundal Height: 20 cm  Pelvic Exam: Perineum: no hemorrhoids, normal perineum   Vulva: normal external genitalia, no lesions   Vagina:  normal mucosa, moderate amount of white thin discharge with odor    Cervix: no lesions and normal, pap smear done.    Adnexa: normal adnexa and no mass, fullness, tenderness   Bony Pelvis: average  System: General: well-developed, obese female in no acute distress   Skin: normal coloration and turgor, no rashes   Neurologic: oriented, normal, negative, normal mood   Extremities: normal strength, tone, and muscle  mass, ROM of all joints is normal   HEENT PERRLA, extraocular movement intact and sclera clear   Mouth/Teeth mucous membranes moist, pharynx normal without lesions and dental hygiene good   Neck supple and no masses   Cardiovascular: regular rate and rhythm   Respiratory:  no respiratory distress, normal breath sounds   Abdomen: soft, non-tender; bowel sounds normal; no masses,  no organomegaly   Assessment:    Pregnancy: G2P0101 Patient Active Problem List   Diagnosis Date Noted  . Supervision of normal pregnancy, antepartum 08/12/2018  . Late prenatal care affecting pregnancy in second trimester 08/12/2018  . Previous cesarean delivery, antepartum 08/12/2018  . Obesity during pregnancy, antepartum  08/12/2018  . Maternal varicella, non-immune 10/03/2016     Plan:    1. Encounter for supervision of normal pregnancy, antepartum, unspecified gravidity - Routine prenatal care  - Rx for prenatal vitamins sent to pharmacy of choice  - Obstetric Panel, Including HIV - Culture, OB Urine - Hemoglobinopathy evaluation - Cystic Fibrosis Mutation 97 - SMN1 COPY NUMBER ANALYSIS (SMA Carrier Screen) - Genetic Screening - Enroll Patient in Babyscripts - Flu Vaccine QUAD 36+ mos IM - [place] MFM OB DETAIL +14 WK; Future - Prenatal Vit-Fe Phos-FA-Omega (VITAFOL GUMMIES) 3.33-0.333-34.8 MG CHEW; Chew 3 tablets by mouth daily.  Dispense: 90 tablet; Refill: 3 - Cytology - PAP( [place])  2. Previous cesarean delivery, antepartum - Previous C/S for FTP after being induced at [redacted]w[redacted]d for Triple I and febrile  - C/S at [facility], notes under care everywhere  - Plans TOLAC, educated and discussed risk/benefits of TOLAC vs Repeat C/S  - [place] MFM OB DETAIL +14 WK; Future  3. Late prenatal care affecting pregnancy in second trimester - Prenatal care initiated at [redacted]w[redacted]d   4. Obesity during pregnancy, antepartum - BMI 37.4, current weight 246lbs  - Educated and discussed recommendations of weight gain during pregnancy  - HgB A1c - aspirin EC 81 MG tablet; Take 1 tablet (81 mg total) by mouth daily. Take after 12 weeks for prevention of preeclampsia later in pregnancy  Dispense: 300 tablet; Refill: 0   Initial labs drawn. Continue prenatal vitamins. Genetic Screening discussed, NIPS: ordered. Ultrasound discussed; fetal anatomic survey: ordered. Problem list reviewed and updated. The nature of [facility] with multiple MDs and other Advanced Practice Providers was explained to patient; also emphasized that residents, students are part of our team. Routine obstetric precautions reviewed. Return in about 27 days (around 09/08/2018) for ROB.       Sharyon Cable, CNM [facility] [employer], [facility]

## 2018-08-12 NOTE — Progress Notes (Signed)
NOB  Planned: No  Last pap: N/A  Genetic Screening: YES  Flu Vaccine: Desires     CC: Nausea and vomiting has improved 2nd trimester.    Last delivery 25 days early.

## 2018-08-13 LAB — CYTOLOGY - PAP
Bacterial vaginitis: POSITIVE — AB
Candida vaginitis: NEGATIVE
Chlamydia: NEGATIVE
Diagnosis: NEGATIVE
Neisseria Gonorrhea: NEGATIVE
Trichomonas: NEGATIVE

## 2018-08-13 LAB — HEMOGLOBIN A1C
Est. average glucose Bld gHb Est-mCnc: 103 mg/dL
Hgb A1c MFr Bld: 5.2 % (ref 4.8–5.6)

## 2018-08-14 LAB — URINE CULTURE, OB REFLEX

## 2018-08-14 LAB — CULTURE, OB URINE

## 2018-08-19 ENCOUNTER — Other Ambulatory Visit (HOSPITAL_COMMUNITY): Payer: Self-pay | Admitting: *Deleted

## 2018-08-19 ENCOUNTER — Ambulatory Visit (HOSPITAL_COMMUNITY)
Admission: RE | Admit: 2018-08-19 | Discharge: 2018-08-19 | Disposition: A | Discharge status: Home / Self Care | Payer: Medicaid Other | Source: Ambulatory Visit | Attending: Certified Nurse Midwife | Admitting: Certified Nurse Midwife

## 2018-08-19 DIAGNOSIS — Z3A20 20 weeks gestation of pregnancy: Secondary | ICD-10-CM | POA: Diagnosis not present

## 2018-08-19 DIAGNOSIS — O09212 Supervision of pregnancy with history of pre-term labor, second trimester: Secondary | ICD-10-CM

## 2018-08-19 DIAGNOSIS — Z363 Encounter for antenatal screening for malformations: Secondary | ICD-10-CM | POA: Diagnosis not present

## 2018-08-19 DIAGNOSIS — O99212 Obesity complicating pregnancy, second trimester: Secondary | ICD-10-CM | POA: Diagnosis not present

## 2018-08-19 DIAGNOSIS — O34219 Maternal care for unspecified type scar from previous cesarean delivery: Secondary | ICD-10-CM | POA: Insufficient documentation

## 2018-08-19 DIAGNOSIS — Z362 Encounter for other antenatal screening follow-up: Secondary | ICD-10-CM

## 2018-08-19 DIAGNOSIS — Z3490 Encounter for supervision of normal pregnancy, unspecified, unspecified trimester: Secondary | ICD-10-CM | POA: Insufficient documentation

## 2018-08-23 LAB — OBSTETRIC PANEL, INCLUDING HIV
Antibody Screen: NEGATIVE
Basophils Absolute: 0 10*3/uL (ref 0.0–0.2)
Basos: 0 %
EOS (ABSOLUTE): 0.2 10*3/uL (ref 0.0–0.4)
Eos: 3 %
HIV Screen 4th Generation wRfx: NONREACTIVE
Hematocrit: 35.5 % (ref 34.0–46.6)
Hemoglobin: 12.1 g/dL (ref 11.1–15.9)
Hepatitis B Surface Ag: NEGATIVE
Immature Grans (Abs): 0.1 10*3/uL (ref 0.0–0.1)
Immature Granulocytes: 1 %
Lymphocytes Absolute: 2.1 10*3/uL (ref 0.7–3.1)
Lymphs: 30 %
MCH: 29.2 pg (ref 26.6–33.0)
MCHC: 34.1 g/dL (ref 31.5–35.7)
MCV: 86 fL (ref 79–97)
Monocytes Absolute: 0.6 10*3/uL (ref 0.1–0.9)
Monocytes: 8 %
Neutrophils Absolute: 4 10*3/uL (ref 1.4–7.0)
Neutrophils: 58 %
Platelets: 299 10*3/uL (ref 150–450)
RBC: 4.15 x10E6/uL (ref 3.77–5.28)
RDW: 14 % (ref 11.7–15.4)
RPR Ser Ql: NONREACTIVE
Rh Factor: POSITIVE
Rubella Antibodies, IGG: <0.9 index — ABNORMAL LOW (ref >0.99)
WBC: 7 10*3/uL (ref 3.4–10.8)

## 2018-08-23 LAB — HEMOGLOBINOPATHY EVALUATION
HGB C: 0 %
HGB S: 0 %
HGB VARIANT: 0 %
Hemoglobin A2 Quantitation: 2 % (ref 1.8–3.2)
Hemoglobin F Quantitation: 1.6 % (ref 0.0–2.0)
Hgb A: 96.4 % (ref 96.4–98.8)

## 2018-08-23 LAB — SMN1 COPY NUMBER ANALYSIS (SMA CARRIER SCREENING)

## 2018-08-23 LAB — CYSTIC FIBROSIS MUTATION 97: Interpretation: NOT DETECTED

## 2018-09-09 ENCOUNTER — Encounter: Payer: Medicaid Other | Admitting: Obstetrics and Gynecology

## 2018-09-10 ENCOUNTER — Telehealth: Payer: Self-pay | Admitting: *Deleted

## 2018-09-10 NOTE — Telephone Encounter (Signed)
I called patient verizion wireless message stated there is restrictions to our phone number. Unable to contact patient regarding missed appointment.

## 2018-09-16 ENCOUNTER — Ambulatory Visit (HOSPITAL_COMMUNITY): Payer: Medicaid Other

## 2018-09-20 ENCOUNTER — Encounter: Payer: Medicaid Other | Admitting: Obstetrics and Gynecology

## 2018-09-21 ENCOUNTER — Ambulatory Visit (HOSPITAL_COMMUNITY): Admission: RE | Admit: 2018-09-21 | Payer: Medicaid Other | Source: Ambulatory Visit

## 2018-09-21 ENCOUNTER — Encounter (HOSPITAL_COMMUNITY): Payer: Self-pay

## 2018-11-10 ENCOUNTER — Encounter: Payer: Self-pay | Admitting: Certified Nurse Midwife

## 2018-11-10 ENCOUNTER — Other Ambulatory Visit: Payer: Medicaid Other

## 2018-11-10 ENCOUNTER — Ambulatory Visit (INDEPENDENT_AMBULATORY_CARE_PROVIDER_SITE_OTHER): Payer: Medicaid Other | Admitting: Certified Nurse Midwife

## 2018-11-10 ENCOUNTER — Other Ambulatory Visit: Payer: Self-pay

## 2018-11-10 VITALS — BP 98/66 | HR 93 | Wt 242.8 lb

## 2018-11-10 DIAGNOSIS — Z283 Underimmunization status: Secondary | ICD-10-CM

## 2018-11-10 DIAGNOSIS — O09893 Supervision of other high risk pregnancies, third trimester: Secondary | ICD-10-CM

## 2018-11-10 DIAGNOSIS — R12 Heartburn: Secondary | ICD-10-CM | POA: Diagnosis not present

## 2018-11-10 DIAGNOSIS — Z2839 Other underimmunization status: Secondary | ICD-10-CM

## 2018-11-10 DIAGNOSIS — Z3A32 32 weeks gestation of pregnancy: Secondary | ICD-10-CM | POA: Diagnosis not present

## 2018-11-10 DIAGNOSIS — O26893 Other specified pregnancy related conditions, third trimester: Secondary | ICD-10-CM

## 2018-11-10 DIAGNOSIS — Z3490 Encounter for supervision of normal pregnancy, unspecified, unspecified trimester: Secondary | ICD-10-CM | POA: Diagnosis not present

## 2018-11-10 DIAGNOSIS — O09899 Supervision of other high risk pregnancies, unspecified trimester: Secondary | ICD-10-CM

## 2018-11-10 DIAGNOSIS — O34219 Maternal care for unspecified type scar from previous cesarean delivery: Secondary | ICD-10-CM | POA: Diagnosis not present

## 2018-11-10 MED ORDER — BLOOD PRESSURE MONITOR KIT: 1.0000 | PACK | Freq: Every day | 0 refills | Status: AC

## 2018-11-10 MED ORDER — FAMOTIDINE 20 MG PO TABS: 20.0000 mg | ORAL_TABLET | Freq: Two times a day (BID) | ORAL | 0 refills | Status: DC

## 2018-11-10 NOTE — Patient Instructions (Signed)

## 2018-11-10 NOTE — Progress Notes (Signed)
Pt is here for ROB/2hr GTT. [redacted]w[redacted]d.

## 2018-11-11 LAB — CBC
Hematocrit: 30.7 % — ABNORMAL LOW (ref 34.0–46.6)
Hemoglobin: 10.4 g/dL — ABNORMAL LOW (ref 11.1–15.9)
MCH: 28.1 pg (ref 26.6–33.0)
MCHC: 33.9 g/dL (ref 31.5–35.7)
MCV: 83 fL (ref 79–97)
Platelets: 240 10*3/uL (ref 150–450)
RBC: 3.7 x10E6/uL — ABNORMAL LOW (ref 3.77–5.28)
RDW: 13.4 % (ref 11.7–15.4)
WBC: 5.9 10*3/uL (ref 3.4–10.8)

## 2018-11-11 LAB — GLUCOSE TOLERANCE, 2 HOURS W/ 1HR
Glucose, 1 hour: 132 mg/dL (ref 65–179)
Glucose, 2 hour: 115 mg/dL (ref 65–152)
Glucose, Fasting: 72 mg/dL (ref 65–91)

## 2018-11-11 LAB — HIV ANTIBODY (ROUTINE TESTING W REFLEX): HIV Screen 4th Generation wRfx: NONREACTIVE

## 2018-11-11 LAB — RPR: RPR Ser Ql: NONREACTIVE

## 2018-11-11 NOTE — Progress Notes (Signed)
   PRENATAL VISIT NOTE  Subjective:  Claudia Moon is a 23 y.o. G2P0101 at [redacted]w[redacted]d being seen today for ongoing prenatal care.  She is currently monitored for the following issues for this low-risk pregnancy and has Maternal varicella, non-immune; Supervision of normal pregnancy, antepartum; Late prenatal care affecting pregnancy in second trimester; Previous cesarean delivery, antepartum; and Obesity during pregnancy, antepartum on their problem list.  Patient reports no complaints.  Contractions: Irritability. Vag. Bleeding: None.  Movement: Present. Denies leaking of fluid.   The following portions of the patient's history were reviewed and updated as appropriate: allergies, current medications, past family history, past medical history, past social history, past surgical history and problem list.   Objective:   Vitals:   11/10/18 0909  BP: 98/66  Pulse: 93  Weight: 242 lb 12.8 oz (110.1 kg)    Fetal Status: Fetal Heart Rate (bpm): 150 Fundal Height: 33 cm Movement: Present     General:  Alert, oriented and cooperative. Patient is in no acute distress.  Skin: Skin is warm and dry. No rash noted.   Cardiovascular: Normal heart rate noted  Respiratory: Normal respiratory effort, no problems with respiration noted  Abdomen: Soft, gravid, appropriate for gestational age.  Pain/Pressure: Present     Pelvic: Cervical exam deferred        Extremities: Normal range of motion.  Edema: None  Mental Status: Normal mood and affect. Normal behavior. Normal judgment and thought content.   Assessment and Plan:  Pregnancy: G2P0101 at [redacted]w[redacted]d 1. Encounter for supervision of normal pregnancy, antepartum, unspecified gravidity - Patient doing well, no complaints - Insufficient prenatal care during the second trimester, patient has been no show to multiple appointments reports "did not want to leave house with coronavirus".  - Educated on webex appointments and virtual visits that prevent spread of  virus, patient verbalizes understanding.  - Anticipatory guidance on upcoming appointments with GBS screening at next appointment - Glucose Tolerance, 2 Hours w/1 Hour - CBC - RPR - HIV Antibody (routine testing w rflx) - Babyscripts Schedule Optimization - [place] MFM OB FOLLOW UP; Future  2. Previous cesarean delivery, antepartum - Plans TOLAC, consent signed   3. Maternal varicella, non-immune  4. Heartburn during pregnancy in third trimester - Reports occasional heartburn during pregnancy  - famotidine (PEPCID) 20 MG tablet; Take 1 tablet (20 mg total) by mouth 2 (two) times daily.  Dispense: 60 tablet; Refill: 0  Preterm labor symptoms and general obstetric precautions including but not limited to vaginal bleeding, contractions, leaking of fluid and fetal movement were reviewed in detail with the patient. Please refer to After Visit Summary for other counseling recommendations.   Return in about 4 weeks (around 12/08/2018) for ROB/GBS .  Future Appointments  Date Time Provider Department Center  11/18/2018 11:15 AM [facility] [place] 4 [facility]  12/08/2018 10:30 AM Sharyon Cable, CNM [facility] None    Sharyon Cable, CNM

## 2018-11-18 ENCOUNTER — Ambulatory Visit (HOSPITAL_COMMUNITY)
Admission: RE | Admit: 2018-11-18 | Discharge: 2018-11-18 | Disposition: A | Discharge status: Home / Self Care | Payer: Medicaid Other | Source: Ambulatory Visit | Attending: Maternal & Fetal Medicine | Admitting: Maternal & Fetal Medicine

## 2018-11-18 ENCOUNTER — Other Ambulatory Visit: Payer: Self-pay

## 2018-11-18 DIAGNOSIS — O34219 Maternal care for unspecified type scar from previous cesarean delivery: Secondary | ICD-10-CM

## 2018-11-18 DIAGNOSIS — O99213 Obesity complicating pregnancy, third trimester: Secondary | ICD-10-CM | POA: Diagnosis not present

## 2018-11-18 DIAGNOSIS — Z3A33 33 weeks gestation of pregnancy: Secondary | ICD-10-CM | POA: Diagnosis not present

## 2018-11-18 DIAGNOSIS — Z362 Encounter for other antenatal screening follow-up: Secondary | ICD-10-CM | POA: Diagnosis not present

## 2018-11-18 DIAGNOSIS — Z136 Encounter for screening for cardiovascular disorders: Secondary | ICD-10-CM | POA: Diagnosis not present

## 2018-11-18 DIAGNOSIS — O09213 Supervision of pregnancy with history of pre-term labor, third trimester: Secondary | ICD-10-CM

## 2018-12-04 ENCOUNTER — Encounter (HOSPITAL_COMMUNITY): Payer: Self-pay | Admitting: *Deleted

## 2018-12-04 ENCOUNTER — Other Ambulatory Visit: Payer: Self-pay

## 2018-12-04 ENCOUNTER — Inpatient Hospital Stay (HOSPITAL_COMMUNITY)
Admission: AD | Admit: 2018-12-04 | Discharge: 2018-12-04 | Disposition: A | Discharge status: Home / Self Care | Payer: Medicaid Other | Attending: Obstetrics and Gynecology | Admitting: Obstetrics and Gynecology

## 2018-12-04 DIAGNOSIS — O9A213 Injury, poisoning and certain other consequences of external causes complicating pregnancy, third trimester: Secondary | ICD-10-CM | POA: Diagnosis not present

## 2018-12-04 DIAGNOSIS — W010XXA Fall on same level from slipping, tripping and stumbling without subsequent striking against object, initial encounter: Secondary | ICD-10-CM | POA: Diagnosis not present

## 2018-12-04 DIAGNOSIS — Z7982 Long term (current) use of aspirin: Secondary | ICD-10-CM | POA: Diagnosis not present

## 2018-12-04 DIAGNOSIS — Z3A35 35 weeks gestation of pregnancy: Secondary | ICD-10-CM | POA: Insufficient documentation

## 2018-12-04 DIAGNOSIS — R102 Pelvic and perineal pain: Secondary | ICD-10-CM | POA: Diagnosis present

## 2018-12-04 DIAGNOSIS — T148XXA Other injury of unspecified body region, initial encounter: Secondary | ICD-10-CM

## 2018-12-04 DIAGNOSIS — O26893 Other specified pregnancy related conditions, third trimester: Secondary | ICD-10-CM | POA: Diagnosis not present

## 2018-12-04 MED ORDER — CYCLOBENZAPRINE HCL 10 MG PO TABS: 10.0000 mg | ORAL_TABLET | Freq: Two times a day (BID) | ORAL | 1 refills | Status: DC | PRN

## 2018-12-04 MED ORDER — CYCLOBENZAPRINE HCL 10 MG PO TABS
10.0000 mg | ORAL_TABLET | Freq: Once | ORAL | Status: AC
  Administered 2018-12-04: 10 mg via ORAL
  Filled 2018-12-04: qty 1

## 2018-12-04 NOTE — MAU Note (Signed)
Pt states around 1pm she had a fall after she mopped the floor. Reports that she did a split and caught herself on the counter, but did not actually hit her stomach. Pt is reporting a lot of pelvic pain and pain when she moves her legs. Denies LOF or vaginal bleeding. Reports good fetal movement.

## 2018-12-04 NOTE — Discharge Instructions (Signed)
Adductor Muscle Strain  An adductor muscle strain, also called a groin strain or pull, is an injury to the muscles or tendons on the upper, inner part of the thigh. These muscles are called the adductor muscles or groin muscles. They are responsible for moving the legs across the body or pulling the legs together. A muscle strain occurs when a muscle is overstretched and some muscle fibers are torn. An adductor muscle strain can range from mild to severe, depending on how many muscle fibers are affected and whether the muscle fibers are partially or completely torn. What are the causes? Adductor muscle strains usually occur during exercise or while participating in sports. The injury often happens when a sudden, violent force is placed on a muscle, stretching the muscle too far. A strain is more likely to happen when your muscles are not warmed up or if you are not properly conditioned. This injury may be caused by:  Stretching the adductor muscles too far or too suddenly, often during side-to-side motion with a sudden change in direction.  Putting repeated stress on the adductor muscles over a long period of time.  Performing vigorous activity without properly stretching the adductor muscles beforehand. What are the signs or symptoms? Symptoms of this condition include:  Pain and tenderness in the groin area. This begins as sharp pain and persists as a dull ache.  A popping or snapping feeling when the injury occurs (for severe strains).  Swelling or bruising.  Muscle spasms.  Weakness in the leg.  Stiffness in the groin area with decreased ability to move the affected muscles. How is this diagnosed? This condition may be diagnosed based on:  Your symptoms and a description of how the injury occurred.  A physical exam.  Imaging tests, such as: ? X-rays. These are sometimes needed to rule out a broken bone or cartilage problems. ? An ultrasound, CT scan, or MRI. These may be done  if your health care provider suspects a complete muscle tear or needs to check for other injuries. How is this treated? An adductor strain will often heal on its own. If needed, this condition may be treated with:  PRICE therapy. PRICE stands for protection of the injured area, rest, ice, pressure (compression), and elevation.  Medicines to help manage pain and swelling (anti-inflammatory medicines).  Crutches. You may be directed to use these for the first few days to minimize your pain. Depending on the severity of the muscle strain, recovery time may vary from a few weeks to several months. Severe injuries often require 4-6 weeks for recovery. In those cases, complete healing can take 4-5 months. Follow these instructions at home: PRICE Therapy   Protect the muscle from being injured again.  Rest. Do not use the strained muscle if it causes pain.  If directed, put ice on the injured area: ? Put ice in a plastic bag. ? Place a towel between your skin and the bag. ? Leave the ice on for 20 minutes, 2-3 times a day. Do this for the first 2 days after the injury.  Apply compression by wrapping the injured area with an elastic bandage as told by your health care provider.  Raise (elevate) the injured area above the level of your heart while you are sitting or lying down. General instructions  Take over-the-counter and prescription medicines only as told by your health care provider.  Walk, stretch, and do exercises as told by your health care provider. Only do these activities if   you can do so without any pain.  Follow your treatment plan as told by your health care provider. This may include: ? Physical therapy. ? Massage. ? Local electrical stimulation (transcutaneous electrical nerve stimulation, TENS). How is this prevented?  Warm up and stretch before being active.  Cool down and stretch after being active.  Give your body time to rest between periods of activity.  Make  sure to use equipment that fits you.  Be safe and responsible while being active to avoid slips and falls.  Maintain physical fitness, including: ? Proper conditioning in the adductor muscles. ? Overall strength, flexibility, and endurance. Contact a health care provider if:  You have increased pain or swelling in the affected area.  Your symptoms are not improving or they are getting worse. Summary  An adductor muscle strain, also called a groin strain or pull, is an injury to the muscles or tendons on the upper, inner part of the thigh.  A muscle strain occurs when a muscle is overstretched and some muscle fibers are torn.  Depending on the severity of the muscle strain, recovery time may vary from a few weeks to several months. This information is not intended to replace advice given to you by your health care provider. Make sure you discuss any questions you have with your health care provider. Document Released: 02/12/2004 Document Revised: 11/16/2017 Document Reviewed: 11/16/2017 Elsevier Interactive Patient Education  2019 Elsevier Inc.  

## 2018-12-04 NOTE — MAU Provider Note (Signed)
History     CSN: [id number]  Arrival date and time: 12/04/18 5188   First Provider Initiated Contact with Patient 12/04/18 2011      Chief Complaint  Patient presents with  . Fall  . Pelvic Pain   Claudia Moon is a 23 y.o. G2P0101 at 21w3dwho receives care at [facility].  She presents today for Fall and Pelvic Pain.  She states that she did a split type fall around 1300 while mopping the floor.  She denies hitting her abdomen, but reports pelvic pain since the incident.  She rates it as a 6-7/10 and describes it as sharp.  She reports sitting helps, but standing and ambulation causes discomfort.  She endorses fetal movement stating "baby feels fine," and denies contractions, LoF, VB, and discharge.  She states she has not taken anything for the pain.       OB History    Gravida  2   Para  1   Term      Preterm  1   AB      Living  1     SAB      TAB      Ectopic      Multiple      Live Births  1           Past Medical History:  Diagnosis Date  . Medical history non-contributory     Past Surgical History:  Procedure Laterality Date  . [facility] SECTION    [facility]     Family History  Problem Relation Age of Onset  . Stroke Mother   . Diabetes Maternal Grandmother   . Diabetes Maternal Grandfather     Social History   Tobacco Use  . Smoking status: Never Smoker  . Smokeless tobacco: Never Used  Substance Use Topics  . Alcohol use: No  . Drug use: No    Allergies: No Known Allergies  Medications Prior to Admission  Medication Sig Dispense Refill Last Dose  . acetaminophen (TYLENOL) 500 MG tablet Take 1,000 mg by mouth every 6 (six) hours as needed for mild pain or headache.   Taking  . albuterol (PROVENTIL HFA;VENTOLIN HFA) 108 (90 Base) MCG/ACT inhaler Inhale 2 puffs into the lungs every 6 (six) hours as needed for wheezing or shortness of breath.   Taking  . aspirin EC 81 MG tablet Take 1 tablet (81 mg total) by  mouth daily. Take after 12 weeks for prevention of preeclampsia later in pregnancy 300 tablet 0   . Blood Pressure Monitor KIT 1 each by Does not apply route daily. 1 each 0   . famotidine (PEPCID) 20 MG tablet Take 1 tablet (20 mg total) by mouth 2 (two) times daily. 60 tablet 0   . nystatin cream (MYCOSTATIN) Apply 1 application topically 2 (two) times daily. Combine with triamcinolone cream & apply thin layer to affected area twice daily (Patient not taking: Reported on 06/28/2018) 15 g 0 Not Taking at Unknown time  . ondansetron (ZOFRAN ODT) 8 MG disintegrating tablet Take 1 tablet (8 mg total) by mouth every 8 (eight) hours as needed for nausea or vomiting. 30 tablet 1   . Prenatal Vit-Fe Fumarate-FA (PRENATAL MULTIVITAMIN) TABS tablet Take 1 tablet by mouth at bedtime.   Not Taking  . Prenatal Vit-Fe Phos-FA-Omega (VITAFOL GUMMIES) 3.33-0.333-34.8 MG CHEW Chew 3 tablets by mouth daily. 90 tablet 3 Taking  . Prenatal-DSS-FeCb-FeGl-FA (CITRANATAL BLOOM) 90-1 MG TABS Take 1 tablet  by mouth daily. (Patient not taking: Reported on 11/10/2018) 30 tablet 12 Not Taking  . promethazine (PHENERGAN) 12.5 MG tablet Take 1 tablet (12.5 mg total) by mouth at bedtime as needed for nausea or vomiting. (Patient not taking: Reported on 11/10/2018) 30 tablet 0 Not Taking    Review of Systems Physical Exam   Blood pressure 114/73, pulse 93, temperature 98.6 F (37 C), temperature source Oral, resp. rate 16, height 5' 8"  (1.727 m), weight 110 kg, last menstrual period 03/31/2018, SpO2 96 %, unknown if currently breastfeeding.  Physical Exam  Fetal Assessment 155 bpm, Mod Var, -Decels, +Accels Toco:None graphed  MAU Course  No results found for this or any previous visit (from the past 24 hour(s)). No results found.  MDM PE Labs:None EFM Pain Medication Assessment and Plan  22 G2P0101  SIUP at 35.3weeks Cat I FT Muscle Strain S/P Fall   -Exam findings discussed. -Educated on muscle  stretching/trauma during fall. -Discussed usage of muscle relaxant, heat vs cold applications, and rest. -Instructed to allow medication to activate (20-30 min) and then get up and move around to assess comfort level.  -Flexeril 5m ordered. -Patient without questions or concerns currently. -Will reassess in ~45 min. -EFM readjusted.   Follow Up (9:03 PM)  -Patient reports no noticeable improvement with Flexeril dosing. -Educated on expectation of pain/discomfort for the next few days. -Patient agreeable to Rx for Flexeril for home usage. -Instructed to refrain from activities while healing. -Given warm compresses for application now.  Follow Up (9:39 PM)  -NST Reactive -Patient without further questions or concerns. -Rx for Flexeril 140mDisp 10 RF 1 sent to pharmacy on file.  -Encouraged to keep appt as scheduled. -Encouraged to call or return to MAU if symptoms worsen or with the onset of new symptoms. -Discharged to home in stable condition.  JeMaryann ConnersSN, CNM 12/04/2018, 8:12 PM

## 2018-12-08 ENCOUNTER — Other Ambulatory Visit: Payer: Self-pay

## 2018-12-08 ENCOUNTER — Encounter: Payer: Self-pay | Admitting: Certified Nurse Midwife

## 2018-12-08 ENCOUNTER — Ambulatory Visit (INDEPENDENT_AMBULATORY_CARE_PROVIDER_SITE_OTHER): Payer: Medicaid Other | Admitting: Certified Nurse Midwife

## 2018-12-08 ENCOUNTER — Other Ambulatory Visit (HOSPITAL_COMMUNITY)
Admission: RE | Admit: 2018-12-08 | Discharge: 2018-12-08 | Disposition: A | Discharge status: Home / Self Care | Payer: Medicaid Other | Source: Ambulatory Visit | Attending: Certified Nurse Midwife | Admitting: Certified Nurse Midwife

## 2018-12-08 VITALS — BP 93/56 | HR 86 | Wt 243.6 lb

## 2018-12-08 DIAGNOSIS — O99213 Obesity complicating pregnancy, third trimester: Secondary | ICD-10-CM | POA: Diagnosis not present

## 2018-12-08 DIAGNOSIS — Z3480 Encounter for supervision of other normal pregnancy, unspecified trimester: Secondary | ICD-10-CM | POA: Insufficient documentation

## 2018-12-08 DIAGNOSIS — O34219 Maternal care for unspecified type scar from previous cesarean delivery: Secondary | ICD-10-CM | POA: Diagnosis not present

## 2018-12-08 DIAGNOSIS — O99210 Obesity complicating pregnancy, unspecified trimester: Secondary | ICD-10-CM

## 2018-12-08 DIAGNOSIS — Z3A36 36 weeks gestation of pregnancy: Secondary | ICD-10-CM | POA: Diagnosis not present

## 2018-12-08 LAB — OB RESULTS CONSOLE GC/CHLAMYDIA: Gonorrhea: NEGATIVE

## 2018-12-08 NOTE — Progress Notes (Signed)
Pt presents for ROB/GBS/GC/CT. Pt wants cx checked today.

## 2018-12-08 NOTE — Progress Notes (Signed)
   PRENATAL VISIT NOTE  Subjective:  Claudia Moon is a 23 y.o. G2P0101 at [redacted]w[redacted]d being seen today for ongoing prenatal care.  She is currently monitored for the following issues for this low-risk pregnancy and has Maternal varicella, non-immune; Supervision of normal pregnancy, antepartum; Late prenatal care affecting pregnancy in second trimester; Previous cesarean delivery, antepartum; and Obesity during pregnancy, antepartum on their problem list.  Patient reports no complaints.  Contractions: Irritability. Vag. Bleeding: None.  Movement: Present. Denies leaking of fluid.   The following portions of the patient's history were reviewed and updated as appropriate: allergies, current medications, past family history, past medical history, past social history, past surgical history and problem list.   Objective:   Vitals:   12/08/18 1055  BP: (!) 93/56  Pulse: 86  Weight: 243 lb 9.6 oz (110.5 kg)    Fetal Status: Fetal Heart Rate (bpm): 144 Fundal Height: 34 cm Movement: Present  Presentation: Vertex  General:  Alert, oriented and cooperative. Patient is in no acute distress.  Skin: Skin is warm and dry. No rash noted.   Cardiovascular: Normal heart rate noted  Respiratory: Normal respiratory effort, no problems with respiration noted  Abdomen: Soft, gravid, appropriate for gestational age.  Pain/Pressure: Present     Pelvic: Cervical exam performed Dilation: 3 Effacement (%): 50 Station: -3  Extremities: Normal range of motion.  Edema: None  Mental Status: Normal mood and affect. Normal behavior. Normal judgment and thought content.   Assessment and Plan:  Pregnancy: G2P0101 at [redacted]w[redacted]d 1. Supervision of other normal pregnancy, antepartum - Patient doing well, no complaints  - Anticipatory guidance on upcoming appointments with [facility] in 2 weeks at in person at 39 weeks  - Routine prenatal care  - Strep Gp B NAA - Cervicovaginal ancillary only( [place])  2. Previous cesarean  delivery, antepartum - Previous C/S for chorio and FTP, plans TOLAC, consent signed on 11/10/2018  3. Obesity during pregnancy, antepartum - EFW 73% at 33 weeks  - 5lb TWG during pregnancy   Preterm labor symptoms and general obstetric precautions including but not limited to vaginal bleeding, contractions, leaking of fluid and fetal movement were reviewed in detail with the patient. Please refer to After Visit Summary for other counseling recommendations.   Return in about 2 weeks (around 12/22/2018) for ROB-mychart visit .  Future Appointments  Date Time Provider [place]  12/22/2018  9:45 AM Lajean Manes, CNM [place] None  12/29/2018 10:30 AM Lajean Manes, CNM [place] None    Lajean Manes, CNM

## 2018-12-08 NOTE — Patient Instructions (Signed)
Reasons to go to MAU:  1.  Contractions are  5 minutes apart or less, each last 1 minute, these have been going on for 1-2 hours, and you cannot walk or talk during them 2.  You have a large gush of fluid, or a trickle of fluid that will not stop and you have to wear a pad 3.  You have bleeding that is bright red, heavier than spotting--like menstrual bleeding (spotting can be normal in early labor or after a check of your cervix) 4.  You do not feel the baby moving like he/she normally does  

## 2018-12-09 LAB — CERVICOVAGINAL ANCILLARY ONLY
Chlamydia: NEGATIVE
Neisseria Gonorrhea: NEGATIVE

## 2018-12-10 LAB — STREP GP B NAA: Strep Gp B NAA: NEGATIVE

## 2018-12-20 ENCOUNTER — Encounter (HOSPITAL_COMMUNITY): Payer: Self-pay | Admitting: *Deleted

## 2018-12-20 ENCOUNTER — Inpatient Hospital Stay (HOSPITAL_COMMUNITY)
Admission: AD | Admit: 2018-12-20 | Discharge: 2018-12-20 | Disposition: A | Discharge status: Home / Self Care | Payer: Medicaid Other | Attending: Obstetrics and Gynecology | Admitting: Obstetrics and Gynecology

## 2018-12-20 ENCOUNTER — Other Ambulatory Visit: Payer: Self-pay

## 2018-12-20 DIAGNOSIS — O471 False labor at or after 37 completed weeks of gestation: Secondary | ICD-10-CM | POA: Diagnosis not present

## 2018-12-20 DIAGNOSIS — O479 False labor, unspecified: Secondary | ICD-10-CM

## 2018-12-20 DIAGNOSIS — Z3480 Encounter for supervision of other normal pregnancy, unspecified trimester: Secondary | ICD-10-CM

## 2018-12-20 DIAGNOSIS — Z3A37 37 weeks gestation of pregnancy: Secondary | ICD-10-CM

## 2018-12-20 DIAGNOSIS — Z3A38 38 weeks gestation of pregnancy: Secondary | ICD-10-CM | POA: Insufficient documentation

## 2018-12-20 DIAGNOSIS — O0932 Supervision of pregnancy with insufficient antenatal care, second trimester: Secondary | ICD-10-CM

## 2018-12-20 NOTE — MAU Note (Signed)
Not in lobby

## 2018-12-20 NOTE — MAU Note (Signed)
Was having slight contractions.  At last appointment, was told she was 3.  Had intercourse last night, noted the d/c was pinkish red.  Started contracting again after that. Has now slowed down a lot, none in over an hour. No longer bleeding. No leaking. Baby is moving.

## 2018-12-20 NOTE — MAU Provider Note (Signed)
RN Labor Evaluation   22yo [id number] at [redacted]w[redacted]d who presents for labor evaluation. Denies leakage of fluid, states contractions irregular since intercourse last night. Initially had a little blood-tinged discharge after intercourse but has now resolved. Normal fetal movement. Reactive and reassuring tracing with no regular contractions, only uterine irritability - 150s/mod/+a/-d. Not in active labor and stable for discharge home. Has next prenatal appointment scheduled. Labor precautions reviewed by RN prior to discharge.   Lambert Mody. Juleen China, DO OB/GYN Fellow

## 2018-12-20 NOTE — Discharge Instructions (Signed)
Braxton Hicks Contractions Contractions of the uterus can occur throughout pregnancy, but they are not always a sign that you are in labor. You may have practice contractions called Braxton Hicks contractions. These false labor contractions are sometimes confused with true labor. What are Braxton Hicks contractions? Braxton Hicks contractions are tightening movements that occur in the muscles of the uterus before labor. Unlike true labor contractions, these contractions do not result in opening (dilation) and thinning of the cervix. Toward the end of pregnancy (32-34 weeks), Braxton Hicks contractions can happen more often and may become stronger. These contractions are sometimes difficult to tell apart from true labor because they can be very uncomfortable. You should not feel embarrassed if you go to the hospital with false labor. Sometimes, the only way to tell if you are in true labor is for your health care provider to look for changes in the cervix. The health care provider will do a physical exam and may monitor your contractions. If you are not in true labor, the exam should show that your cervix is not dilating and your water has not broken. If there are no other health problems associated with your pregnancy, it is completely safe for you to be sent home with false labor. You may continue to have Braxton Hicks contractions until you go into true labor. How to tell the difference between true labor and false labor True labor  Contractions last 30-70 seconds.  Contractions become very regular.  Discomfort is usually felt in the top of the uterus, and it spreads to the lower abdomen and low back.  Contractions do not go away with walking.  Contractions usually become more intense and increase in frequency.  The cervix dilates and gets thinner. False labor  Contractions are usually shorter and not as strong as true labor contractions.  Contractions are usually irregular.  Contractions  are often felt in the front of the lower abdomen and in the groin.  Contractions may go away when you walk around or change positions while lying down.  Contractions get weaker and are shorter-lasting as time goes on.  The cervix usually does not dilate or become thin. Follow these instructions at home:   Take over-the-counter and prescription medicines only as told by your health care provider.  Keep up with your usual exercises and follow other instructions from your health care provider.  Eat and drink lightly if you think you are going into labor.  If Braxton Hicks contractions are making you uncomfortable: ? Change your position from lying down or resting to walking, or change from walking to resting. ? Sit and rest in a tub of warm water. ? Drink enough fluid to keep your urine pale yellow. Dehydration may cause these contractions. ? Do slow and deep breathing several times an hour.  Keep all follow-up prenatal visits as told by your health care provider. This is important. Contact a health care provider if:  You have a fever.  You have continuous pain in your abdomen. Get help right away if:  Your contractions become stronger, more regular, and closer together.  You have fluid leaking or gushing from your vagina.  You pass blood-tinged mucus (bloody show).  You have bleeding from your vagina.  You have low back pain that you never had before.  You feel your baby's head pushing down and causing pelvic pressure.  Your baby is not moving inside you as much as it used to. Summary  Contractions that occur before labor are   called Braxton Hicks contractions, false labor, or practice contractions.  Braxton Hicks contractions are usually shorter, weaker, farther apart, and less regular than true labor contractions. True labor contractions usually become progressively stronger and regular, and they become more frequent.  Manage discomfort from Braxton Hicks contractions  by changing position, resting in a warm bath, drinking plenty of water, or practicing deep breathing. This information is not intended to replace advice given to you by your health care provider. Make sure you discuss any questions you have with your health care provider. Document Released: 10/30/2016 Document Revised: 03/31/2017 Document Reviewed: 10/30/2016 Elsevier Interactive Patient Education  2019 Elsevier Inc.  

## 2018-12-22 ENCOUNTER — Telehealth: Payer: Medicaid Other | Admitting: Certified Nurse Midwife

## 2018-12-23 ENCOUNTER — Encounter (HOSPITAL_COMMUNITY): Payer: Self-pay

## 2018-12-23 ENCOUNTER — Inpatient Hospital Stay (HOSPITAL_COMMUNITY): Payer: Medicaid Other | Admitting: Anesthesiology

## 2018-12-23 ENCOUNTER — Inpatient Hospital Stay (HOSPITAL_COMMUNITY)
Admission: AD | Admit: 2018-12-23 | Discharge: 2018-12-25 | DRG: 807 | Disposition: A | Discharge status: Home / Self Care | Payer: Medicaid Other | Attending: Obstetrics and Gynecology | Admitting: Obstetrics and Gynecology

## 2018-12-23 ENCOUNTER — Other Ambulatory Visit: Payer: Self-pay

## 2018-12-23 DIAGNOSIS — Z3490 Encounter for supervision of normal pregnancy, unspecified, unspecified trimester: Secondary | ICD-10-CM

## 2018-12-23 DIAGNOSIS — Z1159 Encounter for screening for other viral diseases: Secondary | ICD-10-CM

## 2018-12-23 DIAGNOSIS — Z3480 Encounter for supervision of other normal pregnancy, unspecified trimester: Secondary | ICD-10-CM

## 2018-12-23 DIAGNOSIS — O26893 Other specified pregnancy related conditions, third trimester: Secondary | ICD-10-CM | POA: Diagnosis present

## 2018-12-23 DIAGNOSIS — Z3A38 38 weeks gestation of pregnancy: Secondary | ICD-10-CM

## 2018-12-23 DIAGNOSIS — Z30430 Encounter for insertion of intrauterine contraceptive device: Secondary | ICD-10-CM

## 2018-12-23 DIAGNOSIS — O34219 Maternal care for unspecified type scar from previous cesarean delivery: Secondary | ICD-10-CM | POA: Diagnosis not present

## 2018-12-23 DIAGNOSIS — O09899 Supervision of other high risk pregnancies, unspecified trimester: Secondary | ICD-10-CM

## 2018-12-23 DIAGNOSIS — O0932 Supervision of pregnancy with insufficient antenatal care, second trimester: Secondary | ICD-10-CM

## 2018-12-23 DIAGNOSIS — Z2839 Other underimmunization status: Secondary | ICD-10-CM

## 2018-12-23 DIAGNOSIS — O411230 Chorioamnionitis, third trimester, not applicable or unspecified: Secondary | ICD-10-CM | POA: Diagnosis not present

## 2018-12-23 LAB — SARS CORONAVIRUS 2 BY RT PCR (HOSPITAL ORDER, PERFORMED IN ~~LOC~~ HOSPITAL LAB): SARS Coronavirus 2: NEGATIVE

## 2018-12-23 LAB — TYPE AND SCREEN
ABO/RH(D): O POS
Antibody Screen: NEGATIVE

## 2018-12-23 LAB — COMPREHENSIVE METABOLIC PANEL
ALT: 79 U/L — ABNORMAL HIGH (ref 0–44)
AST: 36 U/L (ref 15–41)
Albumin: 3.2 g/dL — ABNORMAL LOW (ref 3.5–5.0)
Alkaline Phosphatase: 221 U/L — ABNORMAL HIGH (ref 38–126)
Anion gap: 8 (ref 5–15)
BUN: 5 mg/dL — ABNORMAL LOW (ref 6–20)
CO2: 20 mmol/L — ABNORMAL LOW (ref 22–32)
Calcium: 8.9 mg/dL (ref 8.9–10.3)
Chloride: 106 mmol/L (ref 98–111)
Creatinine, Ser: 0.4 mg/dL — ABNORMAL LOW (ref 0.44–1.00)
GFR calc Af Amer: >60 mL/min (ref >60)
GFR calc non Af Amer: >60 mL/min (ref >60)
Glucose, Bld: 98 mg/dL (ref 70–99)
Potassium: 4 mmol/L (ref 3.5–5.1)
Sodium: 134 mmol/L — ABNORMAL LOW (ref 135–145)
Total Bilirubin: 0.5 mg/dL (ref 0.3–1.2)
Total Protein: 6.6 g/dL (ref 6.5–8.1)

## 2018-12-23 LAB — CBC
HCT: 35.6 % — ABNORMAL LOW (ref 36.0–46.0)
Hemoglobin: 11.4 g/dL — ABNORMAL LOW (ref 12.0–15.0)
MCH: 26.1 pg (ref 26.0–34.0)
MCHC: 32 g/dL (ref 30.0–36.0)
MCV: 81.7 fL (ref 80.0–100.0)
Platelets: 271 10*3/uL (ref 150–400)
RBC: 4.36 MIL/uL (ref 3.87–5.11)
RDW: 14.4 % (ref 11.5–15.5)
WBC: 8.7 10*3/uL (ref 4.0–10.5)
nRBC: 0 % (ref 0.0–0.2)

## 2018-12-23 LAB — PROTEIN / CREATININE RATIO, URINE
Creatinine, Urine: 169.49 mg/dL
Protein Creatinine Ratio: 0.13 mg/mg{Cre} (ref 0.00–0.15)
Total Protein, Urine: 22 mg/dL

## 2018-12-23 LAB — POCT FERN TEST: POCT Fern Test: NEGATIVE

## 2018-12-23 LAB — ABO/RH: ABO/RH(D): O POS

## 2018-12-23 MED ORDER — WITCH HAZEL-GLYCERIN EX PADS: 1.0000 "application " | MEDICATED_PAD | CUTANEOUS | Status: DC | PRN

## 2018-12-23 MED ORDER — ONDANSETRON HCL 4 MG/2ML IJ SOLN: 4.0000 mg | Freq: Four times a day (QID) | INTRAMUSCULAR | Status: DC | PRN

## 2018-12-23 MED ORDER — SODIUM CHLORIDE (PF) 0.9 % IJ SOLN
INTRAMUSCULAR | Status: DC | PRN
  Administered 2018-12-23: 12 mL/h via EPIDURAL

## 2018-12-23 MED ORDER — LIDOCAINE HCL (PF) 1 % IJ SOLN: 30.0000 mL | INTRAMUSCULAR | Status: DC | PRN

## 2018-12-23 MED ORDER — ACETAMINOPHEN 325 MG PO TABS
650.0000 mg | ORAL_TABLET | ORAL | Status: DC | PRN
  Administered 2018-12-23 – 2018-12-24 (×2): 650 mg via ORAL
  Filled 2018-12-23 (×3): qty 2

## 2018-12-23 MED ORDER — PHENYLEPHRINE 40 MCG/ML (10ML) SYRINGE FOR IV PUSH (FOR BLOOD PRESSURE SUPPORT)
80.0000 ug | PREFILLED_SYRINGE | INTRAVENOUS | Status: DC | PRN
  Administered 2018-12-23: 80 ug via INTRAVENOUS

## 2018-12-23 MED ORDER — LACTATED RINGERS IV SOLN
INTRAVENOUS | Status: DC
  Administered 2018-12-23: 10:00:00 via INTRAVENOUS

## 2018-12-23 MED ORDER — COCONUT OIL OIL: 1.0000 "application " | TOPICAL_OIL | Status: DC | PRN

## 2018-12-23 MED ORDER — OXYTOCIN BOLUS FROM INFUSION
500.0000 mL | Freq: Once | INTRAVENOUS | Status: AC
  Administered 2018-12-23: 15:00:00 500 mL via INTRAVENOUS

## 2018-12-23 MED ORDER — DIPHENHYDRAMINE HCL 50 MG/ML IJ SOLN: 12.5000 mg | INTRAMUSCULAR | Status: DC | PRN

## 2018-12-23 MED ORDER — EPHEDRINE 5 MG/ML INJ: 10.0000 mg | INTRAVENOUS | Status: DC | PRN

## 2018-12-23 MED ORDER — LEVONORGESTREL 19.5 MCG/DAY IU IUD
INTRAUTERINE_SYSTEM | Freq: Once | INTRAUTERINE | Status: AC
  Administered 2018-12-23: 1 via INTRAUTERINE
  Filled 2018-12-23: qty 1

## 2018-12-23 MED ORDER — MEASLES, MUMPS & RUBELLA VAC IJ SOLR: 0.5000 mL | Freq: Once | INTRAMUSCULAR | Status: DC

## 2018-12-23 MED ORDER — FENTANYL CITRATE (PF) 100 MCG/2ML IJ SOLN: 100.0000 ug | INTRAMUSCULAR | Status: DC | PRN

## 2018-12-23 MED ORDER — FENTANYL CITRATE (PF) 100 MCG/2ML IJ SOLN
INTRAMUSCULAR | Status: AC
  Administered 2018-12-23: 09:00:00 100 ug
  Filled 2018-12-23: qty 2

## 2018-12-23 MED ORDER — ONDANSETRON HCL 4 MG/2ML IJ SOLN: 4.0000 mg | INTRAMUSCULAR | Status: DC | PRN

## 2018-12-23 MED ORDER — TETANUS-DIPHTH-ACELL PERTUSSIS 5-2.5-18.5 LF-MCG/0.5 IM SUSP: 0.5000 mL | Freq: Once | INTRAMUSCULAR | Status: DC

## 2018-12-23 MED ORDER — DIBUCAINE (PERIANAL) 1 % EX OINT: 1.0000 "application " | TOPICAL_OINTMENT | CUTANEOUS | Status: DC | PRN

## 2018-12-23 MED ORDER — FENTANYL-BUPIVACAINE-NACL 0.5-0.125-0.9 MG/250ML-% EP SOLN
12.0000 mL/h | EPIDURAL | Status: DC | PRN
  Filled 2018-12-23: qty 250

## 2018-12-23 MED ORDER — SOD CITRATE-CITRIC ACID 500-334 MG/5ML PO SOLN
30.0000 mL | ORAL | Status: DC | PRN
  Administered 2018-12-23: 13:00:00 30 mL via ORAL
  Filled 2018-12-23: qty 30

## 2018-12-23 MED ORDER — PHENYLEPHRINE 40 MCG/ML (10ML) SYRINGE FOR IV PUSH (FOR BLOOD PRESSURE SUPPORT)
80.0000 ug | PREFILLED_SYRINGE | INTRAVENOUS | Status: DC | PRN
  Filled 2018-12-23: qty 10

## 2018-12-23 MED ORDER — LACTATED RINGERS IV SOLN: 500.0000 mL | Freq: Once | INTRAVENOUS | Status: DC

## 2018-12-23 MED ORDER — LACTATED RINGERS IV SOLN
500.0000 mL | INTRAVENOUS | Status: DC | PRN
  Administered 2018-12-23: 1000 mL via INTRAVENOUS

## 2018-12-23 MED ORDER — OXYTOCIN 40 UNITS IN NORMAL SALINE INFUSION - SIMPLE MED
2.5000 [IU]/h | INTRAVENOUS | Status: DC
  Filled 2018-12-23: qty 1000

## 2018-12-23 MED ORDER — OXYCODONE-ACETAMINOPHEN 5-325 MG PO TABS: 1.0000 | ORAL_TABLET | ORAL | Status: DC | PRN

## 2018-12-23 MED ORDER — ONDANSETRON HCL 4 MG PO TABS: 4.0000 mg | ORAL_TABLET | ORAL | Status: DC | PRN

## 2018-12-23 MED ORDER — SENNOSIDES-DOCUSATE SODIUM 8.6-50 MG PO TABS
2.0000 | ORAL_TABLET | ORAL | Status: DC
  Administered 2018-12-24: 2 via ORAL
  Filled 2018-12-23 (×2): qty 2

## 2018-12-23 MED ORDER — BENZOCAINE-MENTHOL 20-0.5 % EX AERO
1.0000 "application " | INHALATION_SPRAY | CUTANEOUS | Status: DC | PRN
  Administered 2018-12-25: 1 via TOPICAL
  Filled 2018-12-23: qty 56

## 2018-12-23 MED ORDER — IBUPROFEN 600 MG PO TABS
600.0000 mg | ORAL_TABLET | Freq: Four times a day (QID) | ORAL | Status: DC
  Administered 2018-12-23 – 2018-12-25 (×7): 600 mg via ORAL
  Filled 2018-12-23 (×6): qty 1

## 2018-12-23 MED ORDER — LIDOCAINE-EPINEPHRINE (PF) 2 %-1:200000 IJ SOLN
INTRAMUSCULAR | Status: DC | PRN
  Administered 2018-12-23 (×2): 3 mL via EPIDURAL

## 2018-12-23 MED ORDER — PRENATAL MULTIVITAMIN CH
1.0000 | ORAL_TABLET | Freq: Every day | ORAL | Status: DC
  Administered 2018-12-24: 1 via ORAL
  Filled 2018-12-23: qty 1

## 2018-12-23 MED ORDER — ACETAMINOPHEN 325 MG PO TABS: 650.0000 mg | ORAL_TABLET | ORAL | Status: DC | PRN

## 2018-12-23 MED ORDER — DIPHENHYDRAMINE HCL 25 MG PO CAPS: 25.0000 mg | ORAL_CAPSULE | Freq: Four times a day (QID) | ORAL | Status: DC | PRN

## 2018-12-23 MED ORDER — SIMETHICONE 80 MG PO CHEW: 80.0000 mg | CHEWABLE_TABLET | ORAL | Status: DC | PRN

## 2018-12-23 MED ORDER — OXYCODONE-ACETAMINOPHEN 5-325 MG PO TABS: 2.0000 | ORAL_TABLET | ORAL | Status: DC | PRN

## 2018-12-23 NOTE — MAU Note (Signed)
Contractions all night- worse at 6:30 am.  No bleeding. Constant moistness since yesterday, no gush.  Baby moving.

## 2018-12-23 NOTE — Anesthesia Postprocedure Evaluation (Signed)
Anesthesia Post Note  Patient: Claudia Moon  Procedure(s) Performed: AN AD [place]     Patient location during evaluation: Mother Baby Anesthesia Type: Epidural Level of consciousness: awake and alert Pain management: pain level controlled Vital Signs Assessment: post-procedure vital signs reviewed and stable Respiratory status: spontaneous breathing, nonlabored ventilation and respiratory function stable Cardiovascular status: stable Postop Assessment: no headache, no backache and epidural receding Anesthetic complications: no    Last Vitals:  Vitals:   12/23/18 1715 12/23/18 1830  BP: 108/77 100/62  Pulse: 100 (!) 107  Resp: 16 16  Temp: 37.5 C 37.2 C  SpO2: 100% 99%    Last Pain:  Vitals:   12/23/18 1830  TempSrc: Oral  PainSc:    Pain Goal: Patients Stated Pain Goal: 0 (12/23/18 0756)                 Gilmer Mor

## 2018-12-23 NOTE — Anesthesia Preprocedure Evaluation (Signed)

## 2018-12-23 NOTE — Procedures (Signed)
Procedure Note: Pt consented for IUD insertion. Pt was in lithotomy position for birth. Liletta IUD inserted without difficulty. Strings cut just inside introitus. Tolerated well.

## 2018-12-23 NOTE — Anesthesia Procedure Notes (Signed)
Epidural Patient location during procedure: OB Start time: 12/23/2018 9:30 AM End time: 12/23/2018 9:45 AM  Staffing Anesthesiologist: Freddrick March, MD Performed: anesthesiologist   Preanesthetic Checklist Completed: patient identified, pre-op evaluation, timeout performed, IV checked, risks and benefits discussed and monitors and equipment checked  Epidural Patient position: sitting Prep: site prepped and draped and DuraPrep Patient monitoring: continuous pulse ox, blood pressure, heart rate and cardiac monitor Approach: midline Location: L3-L4 Injection technique: LOR air  Needle:  Needle type: Tuohy  Needle gauge: 17 G Needle length: 9 cm Needle insertion depth: 7 cm Catheter type: closed end flexible Catheter size: 19 Gauge Catheter at skin depth: 13 cm Test dose: negative  Assessment Sensory level: T8 Events: blood not aspirated, injection not painful, no injection resistance, negative IV test and no paresthesia  Additional Notes Patient identified. Risks/Benefits/Options discussed with patient including but not limited to bleeding, infection, nerve damage, paralysis, failed block, incomplete pain control, headache, blood pressure changes, nausea, vomiting, reactions to medication both or allergic, itching and postpartum back pain. Confirmed with bedside nurse the patient's most recent platelet count. Confirmed with patient that they are not currently taking any anticoagulation, have any bleeding history or any family history of bleeding disorders. Patient expressed understanding and wished to proceed. All questions were answered. Sterile technique was used throughout the entire procedure. Please see nursing notes for vital signs. Test dose was given through epidural catheter and negative prior to continuing to dose epidural or start infusion. Warning signs of high block given to the patient including shortness of breath, tingling/numbness in hands, complete motor block,  or any concerning symptoms with instructions to call for help. Patient was given instructions on fall risk and not to get out of bed. All questions and concerns addressed with instructions to call with any issues or inadequate analgesia.  Reason for block:procedure for pain
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
## 2018-12-23 NOTE — Discharge Summary (Signed)
Postpartum Discharge Summary     Patient Name: Claudia Moon DOB: 18-Nov-1995 MRN: [id number]  Date of admission: 12/23/2018 Delivering Provider: Julianne Handler   Date of discharge: 12/25/2018  Admitting diagnosis: CTX Intrauterine pregnancy: [redacted]w[redacted]d    Secondary diagnosis:  Active Problems:   Intrauterine pregnancy   VBAC (vaginal birth after Cesarean)  Additional problems: previous PTB _0  wks, [facility]    Discharge diagnosis: VBAC                                                                                                Post partum procedures:post placental IUD placement  Augmentation: AROM  Complications: None  Hospital course:  Onset of Labor With Vaginal Delivery     23y.o. yo G2P0101 at 323w1das admitted in Active Labor on 12/23/2018. Patient had an uncomplicated labor course as follows:  Membrane Rupture Time/Date: 12:44 PM ,12/23/2018  -malodorous fluid, no other signs of chorio Intrapartum Procedures: Episiotomy: None [1]                                         Lacerations:  Labial [10]  Patient had a delivery of a Viable infant. 12/23/2018  Information for the patient's newborn:  MoZandra, [id number][[id number]]  Post-placental IUD was placed.  Pateint had an uncomplicated postpartum course.  She is ambulating, tolerating a regular diet, passing flatus, and urinating well. Patient is discharged home in stable condition on 12/25/18.   Magnesium Sulfate recieved: No BMZ received: No  Physical exam  Vitals:   12/24/18 0606 12/24/18 1429 12/24/18 2111 12/25/18 0625  BP: 122/79 113/73 105/78 113/65  Pulse: 82 86 93 68  Resp: _1 Temp: 98.1 F (36.7 C) 98.2 F (36.8 C) 98 F (36.7 C) 98.5 F (36.9 C)  TempSrc: Oral Oral Axillary Axillary  SpO2: 100%   100%  Weight:      Height:       General: alert, cooperative and no distress Lochia: appropriate Uterine Fundus: firm Incision: N/A DVT Evaluation: No evidence of DVT seen  on physical exam. Negative Homan's sign. No significant calf/ankle edema. Labs: Lab Results  Component Value Date   WBC 8.7 12/23/2018   HGB 11.4 (L) 12/23/2018   HCT 35.6 (L) 12/23/2018   MCV 81.7 12/23/2018   PLT 271 12/23/2018   CMP Latest Ref Rng & Units 12/23/2018  Glucose 70 - 99 mg/dL 98  BUN 6 - 20 mg/dL 5(L)  Creatinine 0.44 - 1.00 mg/dL 0.40(L)  Sodium 135 - 145 mmol/L 134(L)  Potassium 3.5 - 5.1 mmol/L 4.0  Chloride 98 - 111 mmol/L 106  CO2 22 - 32 mmol/L 20(L)  Calcium 8.9 - 10.3 mg/dL 8.9  Total Protein 6.5 - 8.1 g/dL 6.6  Total Bilirubin 0.3 - 1.2 mg/dL 0.5  Alkaline Phos 38 - 126 U/L 221(H)  AST 15 - 41 U/L 36  ALT 0 - 44 U/L 79(H)    Discharge instruction: per  After Visit Summary and "Baby and Me Booklet".  After visit meds:  Allergies as of 12/25/2018   No Known Allergies     Medication List    STOP taking these medications   ondansetron 8 MG disintegrating tablet Commonly known as: Zofran ODT     TAKE these medications   Blood Pressure Monitor Kit 1 each by Does not apply route daily.   famotidine 20 MG tablet Commonly known as: Pepcid Take 1 tablet (20 mg total) by mouth 2 (two) times daily.   ibuprofen 600 MG tablet Commonly known as: ADVIL Take 1 tablet (600 mg total) by mouth every 6 (six) hours.   senna-docusate 8.6-50 MG tablet Commonly known as: Senokot-S Take 2 tablets by mouth daily. Start taking on: December 26, 2018   Vitafol Gummies 3.33-0.333-34.8 MG Chew Chew 3 tablets by mouth daily. What changed: how much to take       Diet: routine diet  Activity: Advance as tolerated. Pelvic rest for 6 weeks.   Outpatient follow up:6 weeks Follow up Appt: Future Appointments  Date Time Provider [place]  01/27/2019  1:00 PM Lajean Manes, CNM [facility] None   Follow up Visit:   Please schedule this patient for Postpartum visit in: 6 weeks with the following provider: Any provider For C/S patients schedule nurse  incision check in weeks 2 weeks: no Low risk pregnancy complicated by: previous c/s Delivery mode:  SVD Anticipated Birth Control:  post placental IUD PP Procedures needed: n/a  Schedule Integrated [place] visit: no      Newborn Data: Live born female  Birth Weight:  6 lb 13.7 oz APGAR: 9, 9  Newborn Delivery   Birth date/time: 12/23/2018 15:04:00 Delivery type: VBAC, Spontaneous      Baby Feeding: Breast Disposition:home with mother   12/25/2018 Jorje Guild, NP

## 2018-12-23 NOTE — Progress Notes (Signed)
Labor Progress Note Saachi Zale is a 23 y.o. G2P0101 at [redacted]w[redacted]d presented for labor, TOLAC  S:  Comfortable with epidural.  O:  BP 102/86   Pulse 100   Temp 97.9 F (36.6 C) (Oral)   Resp 16   Ht 5\' 8"  (1.727 m)   Wt 109.4 kg   LMP 03/31/2018 (Approximate)   SpO2 99%   BMI 36.67 kg/m  EFM: baseline 150 bpm/ mod variability/ no accels/ early decels  Toco: q2-3 SVE: Dilation: 5 Effacement (%): 70 Cervical Position: Posterior Station: -2 Presentation: Vertex Exam by:: benji stanley RN EFW by [place]: 7.75 lbs  A/P: 23 y.o. G2P0101 [redacted]w[redacted]d  1. Labor: active 2. FWB: Cat I 3. Pain: epidural  Expectant mngt. Anticipate VBAC.  Julianne Handler, CNM 10:21 AM

## 2018-12-23 NOTE — Progress Notes (Signed)
Labor Progress Note Claudia Moon is a 23 y.o. G2P0101 at [redacted]w[redacted]d presented for labor, TOLAC  S:  Comfortable with epidural.  O:  BP 103/73   Pulse (!) 110   Temp 97.9 F (36.6 C) (Oral)   Resp 18   Ht 5\' 8"  (1.727 m)   Wt 109.4 kg   LMP 03/31/2018 (Approximate)   SpO2 99%   BMI 36.67 kg/m  EFM: baseline 150 bpm/ mod variability/ no accels/ no decels  Toco: q2-3 SVE: 6/90/-2 AROM: clear, malodorous  A/P: 22 y.o. G2P0101 [redacted]w[redacted]d  1. Labor: protracted active 2. FWB: Cat I 3. Pain: epidural  AROM for augmentation, malodorous fluid noted, no signs of chorio, will watch closely and have low threshold for abx. Anticipate VBAC.  Julianne Handler, CNM 12:46 PM

## 2018-12-24 NOTE — Progress Notes (Signed)
POSTPARTUM PROGRESS NOTE  Post Partum Day 1  Subjective:  Claudia Moon is a 23 y.o. T6L4650 s/p VBAC at [redacted]w[redacted]d.  She reports she is doing well. No acute events overnight. She denies any problems with ambulating, voiding or po intake. Denies nausea or vomiting.  Pain is well controlled.  Lochia is mild.  Objective: Blood pressure 122/79, pulse 82, temperature 98.1 F (36.7 C), temperature source Oral, resp. rate 17, height 5\' 8"  (1.727 m), weight 109.4 kg, last menstrual period 03/31/2018, SpO2 100 %, unknown if currently breastfeeding.  Physical Exam:  General: alert, cooperative and no distress Chest: no respiratory distress Heart:regular rate, distal pulses intact Abdomen: soft, nontender,  Uterine Fundus: firm, appropriately tender DVT Evaluation: No calf swelling or tenderness Extremities: No LE edema Skin: warm, dry  Recent Labs    12/23/18 0844  HGB 11.4*  HCT 35.6*    Assessment/Plan: Claudia Moon is a 23 y.o. P5W6568 s/p VBAC at [redacted]w[redacted]d   PPD#1 - Doing well  Routine postpartum care Contraception: PP IUD in place Feeding: Breast  Dispo: Plan for discharge PPD #2.   LOS: 1 day   Phill Myron, D.O. OB Fellow  12/24/2018, 8:41 AM

## 2018-12-25 LAB — RPR: RPR Ser Ql: NONREACTIVE

## 2018-12-25 MED ORDER — IBUPROFEN 600 MG PO TABS: 600.0000 mg | ORAL_TABLET | Freq: Four times a day (QID) | ORAL | 0 refills | Status: DC

## 2018-12-25 MED ORDER — SENNOSIDES-DOCUSATE SODIUM 8.6-50 MG PO TABS: 2.0000 | ORAL_TABLET | ORAL | 0 refills | Status: DC

## 2018-12-25 NOTE — Lactation Note (Signed)
This note was copied from a baby's chart. Lactation Consultation Note Baby 82 hrs old at time of consult. Mom had a few questions that [place] answered. Mom has a 23 yr old that she BF for 8 months. Mom states this baby is BF well. Mom is using DEBP and giving colostrum in bottle. Mom pumped 5 ml colostrum. Encouraged to hand express after pumping to collect more colostrum. Mom has large pendulous breast. Semi flat compressible nipples. Mom has shells but no bra. Mom hopes for d/c in am. Mom states she will wear them when d/c home and wearing bra. Mom denies painful latching.  Discussed newborn behavior, feeding positions, support, breast massage, STS, I&O, breast filling and engorgement. supply and demand.  Mom has no further concerns.  Lactation brochure given.   Patient Name: Claudia Moon DPOEU'M Date: 12/25/2018 Reason for consult: Initial assessment;Early term 37-38.6wks   Maternal Data Has patient been taught Hand Expression?: Yes Does the patient have breastfeeding experience prior to this delivery?: Yes  Feeding Feeding Type: Breast Fed  LATCH Score Latch: Grasps breast easily, tongue down, lips flanged, rhythmical sucking.  Audible Swallowing: A few with stimulation  Type of Nipple: Everted at rest and after stimulation  Comfort (Breast/Nipple): Soft / non-tender  Hold (Positioning): Assistance needed to correctly position infant at breast and maintain latch.  LATCH Score: 8  Interventions Interventions: Breast feeding basics reviewed;Adjust position;Support pillows;Position options;Breast massage  Lactation Tools Discussed/Used Pump Review: Setup, frequency, and cleaning;Milk Storage   Consult Status Consult Status: Complete Date: 12/25/18    Theodoro Kalata 12/25/2018, 12:25 AM

## 2018-12-25 NOTE — Discharge Instructions (Signed)
Vaginal Delivery, Care After °Refer to this sheet in the next few weeks. These instructions provide you with information about caring for yourself after vaginal delivery. Your health care provider may also give you more specific instructions. Your treatment has been planned according to current medical practices, but problems sometimes occur. Call your health care provider if you have any problems or questions. °What can I expect after the procedure? °After vaginal delivery, it is common to have: °· Some bleeding from your vagina. °· Soreness in your abdomen, your vagina, and the area of skin between your vaginal opening and your anus (perineum). °· Pelvic cramps. °· Fatigue. °Follow these instructions at home: °Medicines °· Take over-the-counter and prescription medicines only as told by your health care provider. °· If you were prescribed an antibiotic medicine, take it as told by your health care provider. Do not stop taking the antibiotic until it is finished. °Driving ° °· Do not drive or operate heavy machinery while taking prescription pain medicine. °· Do not drive for 24 hours if you received a sedative. °Lifestyle °· Do not drink alcohol. This is especially important if you are breastfeeding or taking medicine to relieve pain. °· Do not use tobacco products, including cigarettes, chewing tobacco, or e-cigarettes. If you need help quitting, ask your health care provider. °Eating and drinking °· Drink at least 8 eight-ounce glasses of water every day unless you are told not to by your health care provider. If you choose to breastfeed your baby, you may need to drink more water than this. °· Eat high-fiber foods every day. These foods may help prevent or relieve constipation. High-fiber foods include: °? Whole grain cereals and breads. °? Brown rice. °? Beans. °? Fresh fruits and vegetables. °Activity °· Return to your normal activities as told by your health care provider. Ask your health care provider what  activities are safe for you. °· Rest as much as possible. Try to rest or take a nap when your baby is sleeping. °· Do not lift anything that is heavier than your baby or 10 lb (4.5 kg) until your health care provider says that it is safe. °· Talk with your health care provider about when you can engage in sexual activity. This may depend on your: °? Risk of infection. °? Rate of healing. °? Comfort and desire to engage in sexual activity. °Vaginal Care °· If you have an episiotomy or a vaginal tear, check the area every day for signs of infection. Check for: °? More redness, swelling, or pain. °? More fluid or blood. °? Warmth. °? Pus or a bad smell. °· Do not use tampons or douches until your health care provider says this is safe. °· Watch for any blood clots that may pass from your vagina. These may look like clumps of dark red, brown, or black discharge. °General instructions °· Keep your perineum clean and dry as told by your health care provider. °· Wear loose, comfortable clothing. °· Wipe from front to back when you use the toilet. °· Ask your health care provider if you can shower or take a bath. If you had an episiotomy or a perineal tear during labor and delivery, your health care provider may tell you not to take baths for a certain length of time. °· Wear a bra that supports your breasts and fits you well. °· If possible, have someone help you with household activities and help care for your baby for at least a few days after you   leave the hospital. °· Keep all follow-up visits for you and your baby as told by your health care provider. This is important. °Contact a health care provider if: °· You have: °? Vaginal discharge that has a bad smell. °? Difficulty urinating. °? Pain when urinating. °? A sudden increase or decrease in the frequency of your bowel movements. °? More redness, swelling, or pain around your episiotomy or vaginal tear. °? More fluid or blood coming from your episiotomy or vaginal  tear. °? Pus or a bad smell coming from your episiotomy or vaginal tear. °? A fever. °? A rash. °? Little or no interest in activities you used to enjoy. °? Questions about caring for yourself or your baby. °· Your episiotomy or vaginal tear feels warm to the touch. °· Your episiotomy or vaginal tear is separating or does not appear to be healing. °· Your breasts are painful, hard, or turn red. °· You feel unusually sad or worried. °· You feel nauseous or you vomit. °· You pass large blood clots from your vagina. If you pass a blood clot from your vagina, save it to show to your health care provider. Do not flush blood clots down the toilet without having your health care provider look at them. °· You urinate more than usual. °· You are dizzy or light-headed. °· You have not breastfed at all and you have not had a menstrual period for 12 weeks after delivery. °· You have stopped breastfeeding and you have not had a menstrual period for 12 weeks after you stopped breastfeeding. °Get help right away if: °· You have: °? Pain that does not go away or does not get better with medicine. °? Chest pain. °? Difficulty breathing. °? Blurred vision or spots in your vision. °? Thoughts about hurting yourself or your baby. °· You develop pain in your abdomen or in one of your legs. °· You develop a severe headache. °· You faint. °· You bleed from your vagina so much that you fill two sanitary pads in one hour. °This information is not intended to replace advice given to you by your health care provider. Make sure you discuss any questions you have with your health care provider. °Document Released: 06/13/2000 Document Revised: 11/28/2015 Document Reviewed: 07/01/2015 °Elsevier Interactive Patient Education © 2019 [employer] ° °

## 2018-12-29 ENCOUNTER — Encounter: Payer: Medicaid Other | Admitting: Certified Nurse Midwife

## 2019-01-27 ENCOUNTER — Telehealth (INDEPENDENT_AMBULATORY_CARE_PROVIDER_SITE_OTHER): Payer: Medicaid Other | Admitting: Certified Nurse Midwife

## 2019-01-27 ENCOUNTER — Encounter: Payer: Self-pay | Admitting: Certified Nurse Midwife

## 2019-01-27 DIAGNOSIS — F53 Postpartum depression: Secondary | ICD-10-CM

## 2019-01-27 DIAGNOSIS — O99345 Other mental disorders complicating the puerperium: Secondary | ICD-10-CM

## 2019-01-27 DIAGNOSIS — Z975 Presence of (intrauterine) contraceptive device: Secondary | ICD-10-CM

## 2019-01-27 MED ORDER — SERTRALINE HCL 25 MG PO TABS: 25.0000 mg | ORAL_TABLET | Freq: Every day | ORAL | 1 refills | Status: DC

## 2019-01-27 NOTE — Progress Notes (Signed)
TELEHEALTH POSTPARTUM VIRTUAL VIDEO VISIT ENCOUNTER NOTE   Provider location: [facility] [place]   I connected with Claudia Moon on 01/27/19 at  1:29 PM EDT by MyChart Video Encounter at home and verified that I am speaking with the correct person using two identifiers.    I discussed the limitations, risks, security and privacy concerns of performing an evaluation and management service virtually and the availability of in person appointments. I also discussed with the patient that there may be a patient responsible charge related to this service. The patient expressed understanding and agreed to proceed.  Chief Complaint: Postpartum Visit  History of Present Illness: Claudia Moon is a 22 y.o. African-American G2P1102 being evaluated for postpartum followup.    She is s/p vaginal birth after cesarean on 12/23/2018 at 38.1 weeks; she was discharged to home on 12/25/18 D#2. Pregnancy complicated by hx of C/S, late to prenatal cere. Baby is doing well.  Complains of no complaints today.  Vaginal bleeding or discharge: Yes , light to mod Intercourse: No  Contraception: abstinence, was given PP IUD. Mode of feeding infant: breast and bottle  PP depression s/s: No pt states she does feel down some days.  Any bowel or bladder issues: No  Pap smear: no abnormalities (date: 08/12/2018)  Review of Systems: Her 12 point review of systems is negative  Patient Active Problem List   Diagnosis Date Noted  . Intrauterine pregnancy 12/23/2018  . VBAC (vaginal birth after Cesarean) 12/23/2018  . Supervision of normal pregnancy, antepartum 08/12/2018  . Late prenatal care affecting pregnancy in second trimester 08/12/2018  . Previous cesarean delivery, antepartum 08/12/2018  . Obesity during pregnancy, antepartum 08/12/2018  . Maternal varicella, non-immune 10/03/2016    Medications Claudia Moon had no medications administered during this visit. Current Outpatient  Medications  Medication Sig Dispense Refill  . Blood Pressure Monitor KIT 1 each by Does not apply route daily. 1 each 0  . famotidine (PEPCID) 20 MG tablet Take 1 tablet (20 mg total) by mouth 2 (two) times daily. 60 tablet 0  . ibuprofen (ADVIL) 600 MG tablet Take 1 tablet (600 mg total) by mouth every 6 (six) hours. 30 tablet 0  . Prenatal Vit-Fe Phos-FA-Omega (VITAFOL GUMMIES) 3.33-0.333-34.8 MG CHEW Chew 3 tablets by mouth daily. (Patient taking differently: Chew 2 tablets by mouth daily. ) 90 tablet 3  . senna-docusate (SENOKOT-S) 8.6-50 MG tablet Take 2 tablets by mouth daily. 30 tablet 0  . sertraline (ZOLOFT) 25 MG tablet Take 1 tablet (25 mg total) by mouth daily. 30 tablet 1   No current facility-administered medications for this visit.     Allergies Patient has no known allergies.  Physical Exam:  BP 109/78   Pulse 85   LMP 03/31/2018 (Approximate)   General:  Alert, oriented and cooperative. Patient is in no acute distress.  Mental Status: Normal mood and affect. Normal behavior. Normal judgment and thought content.   Respiratory: Normal respiratory effort noted, no problems with respiration noted  Rest of physical exam deferred due to type of encounter  PP Depression Screening:   Edinburgh Postnatal Depression Scale Screening Tool 01/27/2019 12/25/2018  I have been able to laugh and see the funny side of things. 0 1  I have looked forward with enjoyment to things. 0 1  I have blamed myself unnecessarily when things went wrong. 3 0  I have been anxious or worried for no good reason. 3 0  I have felt  scared or panicky for no good reason. 0 0  Things have been getting on top of me. 1 1  I have been so unhappy that I have had difficulty sleeping. 2 2  I have felt sad or miserable. 0 0  I have been so unhappy that I have been crying. 1 1  The thought of harming myself has occurred to me. 0 0  Edinburgh Postnatal Depression Scale Total 10 6     Assessment:Patient is a 23  y.o. O2H4765 who is 4 weeks postpartum from a vaginal birth after cesarean.  She is doing okay. She reports she may be having a stage of postpartum depression.    Plan: 1. Postpartum care and examination - Abnormal postpartum evaluation, with the diagnoses of postpartum depression  - pap 07/2018- normal, pap not needed until 2023  2. IUD (intrauterine device) in place - Postplacental IUD, no issues or complications, denies having IC since placement    3. Postpartum depression, postpartum condition - Patient reports moments when she gets overwhelmed, but other than that its fine, she likes to stay busy in order to not overthink  - Not much support at home, FOB helps sometimes.  - Patient wants to discussed antidepressants  - Educated and discussed zoloft initiation with patient, patient agrees to starting, plan to have a mood check in 4 weeks after initiation  - sertraline (ZOLOFT) 25 MG tablet; Take 1 tablet (25 mg total) by mouth daily.  Dispense: 30 tablet; Refill: 1 - Ambulatory referral to [facility]  I discussed the assessment and treatment plan with the patient. The patient was provided an opportunity to ask questions and all were answered. The patient agreed with the plan and demonstrated an understanding of the instructions.   The patient was advised to call back or seek an in-person evaluation/go to the ED for any concerning postpartum symptoms.  I provided 12 minutes of face-to-face time during this encounter.   Lajean Manes, [place] for [employer], [place]

## 2019-02-04 ENCOUNTER — Telehealth: Payer: Self-pay

## 2019-02-04 NOTE — Telephone Encounter (Signed)
Pt called the triage stating that she thinks her IUD fell out yesterday while she was using the bathroom. She states that she flushed it before she was able to make sure. Pt would like to schedule an appt before her postpartum visit. Pt sent to front desk to schedule appt for iud insert

## 2019-02-11 ENCOUNTER — Telehealth: Payer: Self-pay

## 2019-02-11 NOTE — Telephone Encounter (Signed)
Pt called requesting a return to work letter. Patient has an appt with [facility] on 8/17 for postpartum depression. Pt advised to wait until she has that appt, and that she be cleared from [facility] before providing return to work letter. Pt verbalized understanding.

## 2019-02-14 ENCOUNTER — Other Ambulatory Visit: Payer: Self-pay

## 2019-02-14 ENCOUNTER — Telehealth: Payer: Medicaid Other | Admitting: Clinical

## 2019-02-14 NOTE — BH Specialist Note (Signed)
Pt rescheduled for one week

## 2019-02-17 ENCOUNTER — Ambulatory Visit: Payer: Medicaid Other | Admitting: Obstetrics and Gynecology

## 2019-02-18 ENCOUNTER — Telehealth: Payer: Self-pay | Admitting: Obstetrics & Gynecology

## 2019-02-18 NOTE — Telephone Encounter (Signed)
Called the patient to inform of the upcoming mychart visit. The patient verbalized understanding.

## 2019-02-21 ENCOUNTER — Telehealth (INDEPENDENT_AMBULATORY_CARE_PROVIDER_SITE_OTHER): Payer: Medicaid Other | Admitting: Clinical

## 2019-02-21 ENCOUNTER — Other Ambulatory Visit: Payer: Self-pay

## 2019-02-21 DIAGNOSIS — F4323 Adjustment disorder with mixed anxiety and depressed mood: Secondary | ICD-10-CM

## 2019-02-21 NOTE — BH Specialist Note (Addendum)
Integrated Behavioral Health via Telemedicine Video Visit  TELEPHONE; Technical difficulty with MyChart video  02/21/2019 Torra Pala [id number]  Number of [place] visits: 1 Session Start time: 1:20  Session End time: 1:51 Total time: 30 minutes  Referring Provider: Darrol Poke, CNM Type of Visit: Telephone Patient/Family location: Home [facility] Provider location: [facility] All persons participating in visit: Patient Claudia Moon and Hayden  Confirmed patient's address: Yes  Confirmed patient's phone number: Yes  Any changes to demographics: No   Confirmed patient's insurance: Yes  Any changes to patient's insurance: No   Discussed confidentiality: Yes   I connected with [name] by a telephone enabled telemedicine application and verified that I am speaking with the correct person using two identifiers.     I discussed the limitations of evaluation and management by telemedicine and the availability of in person appointments.  I discussed that the purpose of this visit is to provide behavioral health care while limiting exposure to the novel coronavirus.   Discussed there is a possibility of technology failure and discussed alternative modes of communication if that failure occurs.  I discussed that engaging in this telephone visit, they consent to the provision of behavioral healthcare and the services will be billed under their insurance.  Patient and/or legal guardian expressed understanding and consented to telephone visit: Yes   PRESENTING CONCERNS: Patient and/or family reports the following symptoms/concerns: Pt states her primary concern today is feeling depressed and irritable postpartum, and with recent breakup with FOB;  notices that her symptoms have gone down "a little" since taking Zoloft, and requests increase. Pt copes by taking a few moments to herself outside daily.  Duration of problem: Postpartum; Severity of problem:  moderate  STRENGTHS (Protective Factors/Coping Skills): Self-aware  GOALS ADDRESSED: Patient will: 1.  Reduce symptoms of: anxiety and depression  2.  Increase knowledge and/or ability of: healthy habits  3.  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   Patient may benefit from psychoeducation and brief therapeutic interventions regarding coping with symptoms of depression and anxiety  .  PLAN: 1. Follow up with behavioral health clinician on : One week 2. Behavioral recommendations:  -Continue taking Zoloft as prescribed by medical provider -Continue spending a few moments outdoors daily, as needed; consider using Stop, Breathe, Think app during this time 3. Referral(s): [place] (In Clinic)  I discussed the assessment and treatment plan with the patient and/or parent/guardian. They were provided an opportunity to ask questions and all were answered. They agreed with the plan and demonstrated an understanding of the instructions.   They were advised to call back or seek an in-person evaluation if the symptoms worsen or if the condition fails to improve as anticipated.  Caroleen Hamman McMannes  Depression screen [facility] 2/9 02/21/2019  Decreased Interest 2  Down, Depressed, Hopeless 2  PHQ - 2 Score 4  Altered sleeping 1  Tired, decreased energy 2  Change in appetite 0  Feeling bad or failure about yourself  1  Trouble concentrating 0  Moving slowly or fidgety/restless 1  Suicidal thoughts 0  PHQ-9 Score 9   GAD 7 : Generalized Anxiety Score 02/21/2019  Nervous, Anxious, on Edge 0  Control/stop worrying  2   Worry too much - different things 1  Trouble relaxing 0  Restless 1  Easily annoyed or irritable 3  Afraid - awful might happen 0  Total GAD 7 Score 7

## 2019-02-22 ENCOUNTER — Other Ambulatory Visit: Payer: Self-pay | Admitting: Certified Nurse Midwife

## 2019-02-22 DIAGNOSIS — F53 Postpartum depression: Secondary | ICD-10-CM

## 2019-02-22 DIAGNOSIS — O99345 Other mental disorders complicating the puerperium: Secondary | ICD-10-CM

## 2019-02-22 MED ORDER — SERTRALINE HCL 50 MG PO TABS: 50.0000 mg | ORAL_TABLET | Freq: Every day | ORAL | 2 refills | Status: DC

## 2019-02-24 ENCOUNTER — Ambulatory Visit: Payer: Medicaid Other | Admitting: Certified Nurse Midwife

## 2019-02-28 ENCOUNTER — Telehealth: Payer: Self-pay | Admitting: Family Medicine

## 2019-02-28 NOTE — Telephone Encounter (Signed)
Attempted to call patient about her appointment on 9/1 @ 1:15. No answer and could not leave a voicemail because it was not set-up.

## 2019-03-01 ENCOUNTER — Telehealth (INDEPENDENT_AMBULATORY_CARE_PROVIDER_SITE_OTHER): Payer: Medicaid Other | Admitting: Clinical

## 2019-03-01 ENCOUNTER — Other Ambulatory Visit: Payer: Self-pay

## 2019-03-01 DIAGNOSIS — F4323 Adjustment disorder with mixed anxiety and depressed mood: Secondary | ICD-10-CM

## 2019-03-01 NOTE — BH Specialist Note (Signed)
Integrated Behavioral Health via Telemedicine Video Visit  03/01/2019 Genesis [place]osad [id number]  Number of [place] visits: 2 Session Start time: 1:19  Session End time: 1:40 Total time: 20 minutes  Referring Provider: Darrol Poke, CNM Type of Visit: Video Patient/Family location: Home [facility] Provider location: [facility] All persons participating in visit: Patient Claudia Moon and Porter  Confirmed patient's address: Yes  Confirmed patient's phone number: Yes  Any changes to demographics: No   Confirmed patient's insurance: Yes  Any changes to patient's insurance: No   Discussed confidentiality: At previous visit  I connected with [name] and/or [facility]'s children by a video enabled telemedicine application and verified that I am speaking with the correct person using two identifiers.     I discussed the limitations of evaluation and management by telemedicine and the availability of in person appointments.  I discussed that the purpose of this visit is to provide behavioral health care while limiting exposure to the novel coronavirus.   Discussed there is a possibility of technology failure and discussed alternative modes of communication if that failure occurs.  I discussed that engaging in this video visit, they consent to the provision of behavioral healthcare and the services will be billed under their insurance.  Patient and/or legal guardian expressed understanding and consented to video visit: Yes   PRESENTING CONCERNS: Patient and/or family reports the following symptoms/concerns: Pt states that since increasing Zoloft dose, and getting out of the house more this past week, she is having "a chill week", and feeling she has greater control over past irritability and depression.  Duration of problem: Postpartum; Severity of problem: moderate  STRENGTHS (Protective Factors/Coping Skills): Self-aware, and supportive  friends  GOALS ADDRESSED: Patient will: 1.  Reduce symptoms of: anxiety and depression  2.  Increase knowledge and/or ability of: healthy habits  3.  Demonstrate ability to: Increase healthy adjustment to current life circumstances  INTERVENTIONS: Interventions utilized:  Behavioral Activation and Medication Monitoring Standardized Assessments completed: Not Needed  ASSESSMENT: Patient currently experiencing Adjustment disorder with mixed depression and anxiety.   Patient may benefit from continued brief therapeutic intervention regarding coping with symptoms of anxiety and depression.  PLAN: 1. Follow up with behavioral health clinician on : As needed 2. Behavioral recommendations:  -Continue taking BH medication as prescribed -Establish care with a PCP as soon as able -Continue with plans for birthday celebration and girls trip with friends; continue spending time with friends at least weekly 3. Referral(s): [place] (In Clinic)  I discussed the assessment and treatment plan with the patient and/or parent/guardian. They were provided an opportunity to ask questions and all were answered. They agreed with the plan and demonstrated an understanding of the instructions.   They were advised to call back or seek an in-person evaluation if the symptoms worsen or if the condition fails to improve as anticipated.  Caroleen Hamman   Depression screen [facility] 2/9 02/21/2019  Decreased Interest 2  Down, Depressed, Hopeless 2  PHQ - 2 Score 4  Altered sleeping 1  Tired, decreased energy 2  Change in appetite 0  Feeling bad or failure about yourself  1  Trouble concentrating 0  Moving slowly or fidgety/restless 1  Suicidal thoughts 0  PHQ-9 Score 9   GAD 7 : Generalized Anxiety Score 02/21/2019  Nervous, Anxious, on Edge 0  Control/stop worrying 2  Worry too much - different things 1  Trouble relaxing 0  Restless 1  Easily annoyed  or irritable 3  Afraid  - awful might happen 0  Total GAD 7 Score 7

## 2019-03-02 ENCOUNTER — Other Ambulatory Visit: Payer: Self-pay | Admitting: Certified Nurse Midwife

## 2019-03-02 DIAGNOSIS — O99345 Other mental disorders complicating the puerperium: Secondary | ICD-10-CM

## 2019-03-02 DIAGNOSIS — F53 Postpartum depression: Secondary | ICD-10-CM

## 2019-03-04 ENCOUNTER — Telehealth: Payer: Self-pay | Admitting: Certified Nurse Midwife

## 2019-03-14 ENCOUNTER — Ambulatory Visit: Payer: Medicaid Other | Admitting: Advanced Practice Midwife

## 2019-04-01 ENCOUNTER — Ambulatory Visit: Payer: Medicaid Other | Admitting: Medical

## 2019-04-05 ENCOUNTER — Ambulatory Visit: Payer: Medicaid Other | Admitting: Obstetrics

## 2019-04-19 ENCOUNTER — Ambulatory Visit: Payer: Medicaid Other | Admitting: Obstetrics

## 2019-05-16 ENCOUNTER — Other Ambulatory Visit: Payer: Self-pay

## 2019-05-16 ENCOUNTER — Ambulatory Visit (INDEPENDENT_AMBULATORY_CARE_PROVIDER_SITE_OTHER): Payer: Medicaid Other

## 2019-05-16 ENCOUNTER — Ambulatory Visit (HOSPITAL_COMMUNITY)
Admission: EM | Admit: 2019-05-16 | Discharge: 2019-05-16 | Disposition: A | Discharge status: Home / Self Care | Payer: Medicaid Other | Attending: Internal Medicine | Admitting: Internal Medicine

## 2019-05-16 ENCOUNTER — Encounter (HOSPITAL_COMMUNITY): Payer: Self-pay | Admitting: Emergency Medicine

## 2019-05-16 DIAGNOSIS — M79642 Pain in left hand: Secondary | ICD-10-CM

## 2019-05-16 DIAGNOSIS — S161XXA Strain of muscle, fascia and tendon at neck level, initial encounter: Secondary | ICD-10-CM

## 2019-05-16 DIAGNOSIS — Z3202 Encounter for pregnancy test, result negative: Secondary | ICD-10-CM

## 2019-05-16 DIAGNOSIS — S6992XA Unspecified injury of left wrist, hand and finger(s), initial encounter: Secondary | ICD-10-CM | POA: Diagnosis not present

## 2019-05-16 DIAGNOSIS — S39012A Strain of muscle, fascia and tendon of lower back, initial encounter: Secondary | ICD-10-CM

## 2019-05-16 LAB — POCT PREGNANCY, URINE: Preg Test, Ur: NEGATIVE

## 2019-05-16 MED ORDER — CYCLOBENZAPRINE HCL 5 MG PO TABS: 5.0000 mg | ORAL_TABLET | Freq: Two times a day (BID) | ORAL | 0 refills | Status: DC | PRN

## 2019-05-16 MED ORDER — IBUPROFEN 800 MG PO TABS: 800.0000 mg | ORAL_TABLET | Freq: Three times a day (TID) | ORAL | 0 refills | Status: DC
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
## 2019-05-16 NOTE — Discharge Instructions (Addendum)
Xray normal, no fracture  Use anti-inflammatories for pain/swelling. You may take up to 800 mg Ibuprofen every 8 hours with food. You may supplement Ibuprofen with Tylenol [phone] mg every 8 hours.   You may use flexeril as needed to help with pain. This is a muscle relaxer and causes sedation- please use only at bedtime or when you will be home and not have to drive/work  Alternate Ice and heat Gentle stretching of neck and back  Follow up if not improving or worsening
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
## 2019-06-23 ENCOUNTER — Encounter (HOSPITAL_COMMUNITY): Payer: Self-pay | Admitting: Emergency Medicine

## 2019-06-23 ENCOUNTER — Ambulatory Visit (HOSPITAL_COMMUNITY)
Admission: EM | Admit: 2019-06-23 | Discharge: 2019-06-23 | Disposition: A | Discharge status: Home / Self Care | Payer: Medicaid Other | Attending: Family Medicine | Admitting: Family Medicine

## 2019-06-23 ENCOUNTER — Other Ambulatory Visit: Payer: Self-pay

## 2019-06-23 DIAGNOSIS — Z113 Encounter for screening for infections with a predominantly sexual mode of transmission: Secondary | ICD-10-CM | POA: Diagnosis not present

## 2019-06-23 DIAGNOSIS — Z7251 High risk heterosexual behavior: Secondary | ICD-10-CM

## 2019-06-23 DIAGNOSIS — Z202 Contact with and (suspected) exposure to infections with a predominantly sexual mode of transmission: Secondary | ICD-10-CM

## 2019-06-23 LAB — HIV ANTIBODY (ROUTINE TESTING W REFLEX): HIV Screen 4th Generation wRfx: NONREACTIVE

## 2019-06-23 NOTE — Discharge Instructions (Signed)
We have sent testing for sexually transmitted infections. We will notify you of any positive results once they are received. If required, we will prescribe any medications you might need.  Please refrain from all sexual activity for at least the next seven days.  

## 2019-06-23 NOTE — ED Triage Notes (Signed)
Pt here for STD check...Marland Kitchen pt is asymptomatic ... x-partner has mult partners  A&O X4... NAD.Marland Kitchen. ambulatory

## 2019-06-23 NOTE — ED Provider Notes (Signed)
[facility]   [id number] 06/23/19 Arrival Time: 1140  ASSESSMENT & PLAN:  1. Screening for STDs (sexually transmitted diseases)     Without symptoms.   Discharge Instructions     We have sent testing for sexually transmitted infections. We will notify you of any positive results once they are received. If required, we will prescribe any medications you might need.  Please refrain from all sexual activity for at least the next seven days.     Pending: Labs Reviewed  RPR  HIV ANTIBODY (ROUTINE TESTING W REFLEX)  CERVICOVAGINAL ANCILLARY ONLY    Will notify of any positive results. Instructed to refrain from sexual activity for at least seven days.  Reviewed expectations re: course of current medical issues. Questions answered. Outlined signs and symptoms indicating need for more acute intervention. Patient verbalized understanding. After Visit Summary given.   SUBJECTIVE:  Claudia Moon is a 23 y.o. female who requests STD screening. No symptoms. Denies: urinary frequency, dysuria and gross hematuria as well as vaginal discharge. Afebrile. No abdominal or pelvic pain. Normal PO intake wihout n/v. No genital rashes or lesions. Reports that she is sexually active with multiple female partners (two) without regular condom use.  Patient's last menstrual period was 06/23/2019.  ROS: As per HPI.  OBJECTIVE:  Vitals:   06/23/19 1212  BP: 116/69  Pulse: 83  Resp: 16  Temp: 98.5 F (36.9 C)  TempSrc: Oral  SpO2: 96%     General appearance: alert, cooperative, appears stated age and no distress Throat: lips, mucosa, and tongue normal; teeth and gums normal CV: RRR Lungs: CTAB Back: no CVA tenderness; FROM at waist Abdomen: soft, non-tender GU: deferred Skin: warm and dry Psychological: alert and cooperative; normal mood and affect.    Labs Reviewed  RPR  HIV ANTIBODY (ROUTINE TESTING W REFLEX)  CERVICOVAGINAL ANCILLARY ONLY    No Known  Allergies  Past Medical History:  Diagnosis Date  . Late prenatal care affecting pregnancy in second trimester 08/12/2018  . Medical history non-contributory   . Previous cesarean delivery, antepartum 08/12/2018   Plans TOLAC, consent signed   . Supervision of normal pregnancy, antepartum 08/12/2018    Nursing Staff Provider Office Location  [place]  Dating   LMP Language  ENGLISH  Anatomy US   f/u 4 weeks  Flu Vaccine  08/12/18 Genetic Screen  NIPS: ordered    TDaP vaccine    Hgb A1C or  GTT Early A1C 5.2 Third trimester: 72-132-115 Rhogam     LAB RESULTS  Feeding Plan Breast  Blood Type O/Positive/-- (02/13 1345)  Contraception IUD OR NEXPLANON Antibody Negative (02/13 1345) Circumcision Undec   Family History  Problem Relation Age of Onset  . Stroke Mother   . Hypertension Mother   . Diabetes Maternal Grandmother   . Diabetes Maternal Grandfather    Social History   Socioeconomic History  . Marital status: Single    Spouse name: Not on file  . Number of children: Not on file  . Years of education: Not on file  . Highest education level: Not on file  Occupational History  . Not on file  Tobacco Use  . Smoking status: Never Smoker  . Smokeless tobacco: Never Used  Substance and Sexual Activity  . Alcohol use: No  . Drug use: No  . Sexual activity: Yes    Partners: Male    Birth control/protection: None  Other Topics Concern  . Not on file  Social History Narrative  .  Not on file   Social Determinants of Health   Financial Resource Strain:   . Difficulty of Paying Living Expenses: Not on file  Food Insecurity:   . Worried About Programme researcher, broadcasting/film/video in the Last Year: Not on file  . Ran Out of Food in the Last Year: Not on file  Transportation Needs:   . Lack of Transportation (Medical): Not on file  . Lack of Transportation (Non-Medical): Not on file  Physical Activity:   . Days of Exercise per Week: Not on file  . Minutes of Exercise per Session: Not on file  Stress:    . Feeling of Stress : Not on file  Social Connections:   . Frequency of Communication with Friends and Family: Not on file  . Frequency of Social Gatherings with Friends and Family: Not on file  . Attends Religious Services: Not on file  . Active Member of Clubs or Organizations: Not on file  . Attends Banker Meetings: Not on file  . Marital Status: Not on file  Intimate Partner Violence:   . Fear of Current or Ex-Partner: Not on file  . Emotionally Abused: Not on file  . Physically Abused: Not on file  . Sexually Abused: Not on file          Mardella Layman, MD 06/23/19 [phone]

## 2019-06-24 LAB — RPR: RPR Ser Ql: NONREACTIVE

## 2019-06-27 ENCOUNTER — Telehealth: Payer: Medicaid Other

## 2019-06-28 ENCOUNTER — Telehealth (HOSPITAL_COMMUNITY): Payer: Self-pay | Admitting: Family Medicine

## 2019-06-28 LAB — CERVICOVAGINAL ANCILLARY ONLY
Bacterial vaginitis: POSITIVE — AB
Candida vaginitis: POSITIVE — AB
Chlamydia: POSITIVE — AB
Neisseria Gonorrhea: NEGATIVE
Trichomonas: POSITIVE — AB

## 2019-06-28 MED ORDER — METRONIDAZOLE 500 MG PO TABS: 500.0000 mg | ORAL_TABLET | Freq: Two times a day (BID) | ORAL | 0 refills | Status: DC

## 2019-06-28 MED ORDER — FLUCONAZOLE 150 MG PO TABS: 150.0000 mg | ORAL_TABLET | Freq: Every day | ORAL | 0 refills | Status: DC

## 2019-06-28 MED ORDER — AZITHROMYCIN 250 MG PO TABS: 1000.0000 mg | ORAL_TABLET | Freq: Once | ORAL | 0 refills | Status: AC

## 2019-06-28 NOTE — Telephone Encounter (Signed)
Sending pt treatment for BV, chlamydia, trichomoniasis and yeast infection.

## 2019-06-29 ENCOUNTER — Telehealth: Payer: Medicaid Other

## 2019-08-11 ENCOUNTER — Other Ambulatory Visit: Payer: Self-pay | Admitting: Certified Nurse Midwife

## 2019-08-11 ENCOUNTER — Telehealth: Payer: Self-pay | Admitting: *Deleted

## 2019-08-11 DIAGNOSIS — R110 Nausea: Secondary | ICD-10-CM

## 2019-08-11 MED ORDER — ONDANSETRON 4 MG PO TBDP: 4.0000 mg | ORAL_TABLET | Freq: Three times a day (TID) | ORAL | 0 refills | Status: DC | PRN

## 2019-08-11 NOTE — Telephone Encounter (Signed)
Pt called to office stating she is out of Zoloft refills and would like Rx sent in. Pt states she has not yet been able to find a PCP- recommendations given.  Pt states she would also like to know if she can have a letter stating that she may keep her current pet with her as it is an "emotional support dog", otherwise she will have to place animal elsewhere.   Made aware that message would be sent to provider. Rx may be sent to [facility] on [address].    Please advise on Rx and letter.  Please route reply to Clinical Pool.

## 2019-08-12 ENCOUNTER — Other Ambulatory Visit: Payer: Self-pay | Admitting: *Deleted

## 2019-08-12 DIAGNOSIS — F53 Postpartum depression: Secondary | ICD-10-CM

## 2019-08-12 MED ORDER — SERTRALINE HCL 50 MG PO TABS: 50.0000 mg | ORAL_TABLET | Freq: Every day | ORAL | 2 refills | Status: DC

## 2019-08-12 NOTE — Progress Notes (Signed)
Refill on zoloft sent per [facility] approval.
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
## 2020-02-20 ENCOUNTER — Telehealth: Payer: Medicaid Other | Admitting: Family

## 2020-02-20 DIAGNOSIS — H00011 Hordeolum externum right upper eyelid: Secondary | ICD-10-CM | POA: Diagnosis not present

## 2020-02-20 MED ORDER — POLYMYXIN B-TRIMETHOPRIM 10000-0.1 UNIT/ML-% OP SOLN: 1.0000 [drp] | OPHTHALMIC | 0 refills | Status: DC

## 2020-02-20 NOTE — Progress Notes (Signed)
We are sorry that you are not feeling well. Here is how we plan to help!  Based on what you have shared with me it looks like you have a stye.  A stye is an inflammation of the eyelid.  It is often a red, painful lump near the edge of the eyelid that may look like a boil or a pimple.  A stye develops when an infection occurs at the base of an eyelash.   We have made appropriate suggestions for you based upon your presentation: Your symptoms may indicate an infection of the sclera.  The use of anti-inflammatory and antibiotic eye drops for a week will help resolve this condition.  I have sent in neomycin-polymyxin HC opthalmic suspension, two to three drops in the affected eye every 4 hours.  If your symptoms do not improve over the next two to three days you should be seen in your doctor's office.  HOME CARE:   Wash your hands often!  Let the stye open on its own. Don't squeeze or open it.  Don't rub your eyes. This can irritate your eyes and let in bacteria.  If you need to touch your eyes, wash your hands first.  Don't wear eye makeup or contact lenses until the area has healed.  GET HELP RIGHT AWAY IF:   Your symptoms do not improve.  You develop blurred or loss of vision.  Your symptoms worsen (increased discharge, pain or redness).  Thank you for choosing an e-visit.  Your e-visit answers were reviewed by a board certified advanced clinical practitioner to complete your personal care plan.  Depending upon the condition, your plan could have included both over the counter or prescription medications.  Please review your pharmacy choice.  Make sure the pharmacy is open so you can pick up prescription now.  If there is a problem, you may contact your provider through MyChart messaging and have the prescription routed to another pharmacy.    Your safety is important to us.  If you have drug allergies check your prescription carefully.  For the next 24 hours you can use MyChart to  ask questions about today's visit, request a non-urgent call back, or ask for a work or school excuse.  You will get an email in the next two days asking about your experience.  I hope you that your e-visit has been valuable and will speed your recovery.    Greater than 5 minutes, yet less than 10 minutes of time have been spent researching, coordinating, and implementing care for this patient today.  Thank you for the details you included in the comment boxes. Those details are very helpful in determining the best course of treatment for you and help us to provide the best care.  

## 2020-06-12 ENCOUNTER — Encounter: Payer: Self-pay | Admitting: Nurse Practitioner

## 2020-06-27 ENCOUNTER — Ambulatory Visit: Payer: Self-pay | Admitting: Nurse Practitioner

## 2020-12-15 ENCOUNTER — Ambulatory Visit (HOSPITAL_COMMUNITY)
Admission: EM | Admit: 2020-12-15 | Discharge: 2020-12-15 | Disposition: A | Discharge status: Home / Self Care | Payer: Medicaid Other | Attending: Family Medicine | Admitting: Family Medicine

## 2020-12-15 ENCOUNTER — Encounter (HOSPITAL_COMMUNITY): Payer: Self-pay

## 2020-12-15 ENCOUNTER — Ambulatory Visit (INDEPENDENT_AMBULATORY_CARE_PROVIDER_SITE_OTHER): Payer: Medicaid Other

## 2020-12-15 DIAGNOSIS — R509 Fever, unspecified: Secondary | ICD-10-CM | POA: Diagnosis not present

## 2020-12-15 DIAGNOSIS — R062 Wheezing: Secondary | ICD-10-CM

## 2020-12-15 DIAGNOSIS — J069 Acute upper respiratory infection, unspecified: Secondary | ICD-10-CM

## 2020-12-15 DIAGNOSIS — R059 Cough, unspecified: Secondary | ICD-10-CM

## 2020-12-15 MED ORDER — PREDNISONE 20 MG PO TABS: 40.0000 mg | ORAL_TABLET | Freq: Every day | ORAL | 0 refills | Status: DC

## 2020-12-15 MED ORDER — ACETAMINOPHEN 325 MG PO TABS
ORAL_TABLET | ORAL | Status: AC
  Filled 2020-12-15: qty 2

## 2020-12-15 MED ORDER — ACETAMINOPHEN 325 MG PO TABS
650.0000 mg | ORAL_TABLET | Freq: Once | ORAL | Status: AC
  Administered 2020-12-15: 650 mg via ORAL

## 2020-12-15 NOTE — ED Triage Notes (Signed)
Pt presents with a cough, vomiting and chest pain when coughing X 2 weeks.

## 2020-12-17 NOTE — ED Provider Notes (Signed)
[facility]   [id number] 12/15/20 Arrival Time: 1502  ASSESSMENT & PLAN:  1. Viral URI with cough   2. Fever, unspecified fever cause   3. Wheezing    I have personally viewed the imaging studies ordered this visit. No signs of PNA.  Discussed typical duration of viral illnesses. OTC symptom care as needed.  Begin: Meds ordered this encounter  Medications   acetaminophen (TYLENOL) tablet 650 mg   predniSONE (DELTASONE) 20 MG tablet    Sig: Take 2 tablets (40 mg total) by mouth daily.    Dispense:  10 tablet    Refill:  0     Follow-up Information     [facility] at [facility].   Specialty: Urgent Care Why: If worsening or failing to improve as anticipated. Contact information: [address] [place] [address] [phone]                Reviewed expectations re: course of current medical issues. Questions answered. Outlined signs and symptoms indicating need for more acute intervention. Understanding verbalized. After Visit Summary given.   SUBJECTIVE: History from: patient. Claudia Moon is a 25 y.o. female who reports cough and subj fever; sick for 1-2 weeks. Fatigued. Questions wheezing at times. No specific SOB. CP from coughing.  Social History   Tobacco Use  Smoking Status Never  Smokeless Tobacco Never   Children sick with similar. Normal PO intake without n/v/d.   OBJECTIVE:  Vitals:   12/15/20 1529  BP: 106/83  Pulse: (!) 104  Resp: 20  Temp: (!) 101.7 F (38.7 C)  TempSrc: Oral  SpO2: 100%   Fever and slight tachycardia noted.  General appearance: alert; no distress Eyes: PERRLA; EOMI; conjunctiva normal HENT: [place]; AT; with nasal congestion Neck: supple  Lungs: speaks full sentences without difficulty; unlabored; bilateral exp wheezing; active cough Extremities: no edema Skin: warm and dry Neurologic: normal gait Psychological: alert and cooperative; normal mood and  affect  Imaging: DG Chest 2 View  Result Date: 12/15/2020 CLINICAL DATA:  Fever and cough for 2 weeks EXAM: CHEST - 2 VIEW COMPARISON:  None. FINDINGS: Cardiac shadow is enlarged. Thoracic aorta is tortuous. No focal infiltrate or effusion is seen. No bony abnormality is noted. IMPRESSION: No active cardiopulmonary disease. Electronically Signed   By: Alcide Clever M.D.   On: 12/15/2020 16:15    No Known Allergies  Past Medical History:  Diagnosis Date   Late prenatal care affecting pregnancy in second trimester 08/12/2018   Medical history non-contributory    Previous cesarean delivery, antepartum 08/12/2018   Plans TOLAC, consent signed    Supervision of normal pregnancy, antepartum 08/12/2018    Nursing Staff Provider Office Location  [place]  Dating   LMP Language  ENGLISH  Anatomy US   f/u 4 weeks  Flu Vaccine  08/12/18 Genetic Screen  NIPS: ordered    TDaP vaccine    Hgb A1C or  GTT Early A1C 5.2 Third trimester: 72-132-115 Rhogam     LAB RESULTS  Feeding Plan Breast  Blood Type O/Positive/-- (02/13 1345)  Contraception IUD OR NEXPLANON Antibody Negative (02/13 1345) Circumcision Undec   Social History   Socioeconomic History   Marital status: Single    Spouse name: Not on file   Number of children: Not on file   Years of education: Not on file   Highest education level: Not on file  Occupational History   Not on file  Tobacco Use  Smoking status: Never   Smokeless tobacco: Never  Vaping Use   Vaping Use: Never used  Substance and Sexual Activity   Alcohol use: No   Drug use: No   Sexual activity: Yes    Partners: Male    Birth control/protection: None  Other Topics Concern   Not on file  Social History Narrative   Not on file   Social Determinants of Health   Financial Resource Strain: Not on file  Food Insecurity: Not on file  Transportation Needs: Not on file  Physical Activity: Not on file  Stress: Not on file  Social Connections: Not on file  Intimate  Partner Violence: Not on file   Family History  Problem Relation Age of Onset   Stroke Mother    Hypertension Mother    Diabetes Maternal Grandmother    Diabetes Maternal Grandfather    Past Surgical History:  Procedure Laterality Date   CESAREAN SECTION     multiple surgery from [facility]       Mardella Layman, MD 12/17/20 [phone]

## 2020-12-18 ENCOUNTER — Ambulatory Visit (HOSPITAL_COMMUNITY)
Admission: EM | Admit: 2020-12-18 | Discharge: 2020-12-18 | Disposition: A | Discharge status: Home / Self Care | Payer: Medicaid Other | Attending: Student | Admitting: Student

## 2020-12-18 ENCOUNTER — Encounter (HOSPITAL_COMMUNITY): Payer: Self-pay

## 2020-12-18 ENCOUNTER — Other Ambulatory Visit: Payer: Self-pay

## 2020-12-18 DIAGNOSIS — J209 Acute bronchitis, unspecified: Secondary | ICD-10-CM

## 2020-12-18 MED ORDER — BENZONATATE 100 MG PO CAPS: 100.0000 mg | ORAL_CAPSULE | Freq: Three times a day (TID) | ORAL | 0 refills | Status: DC

## 2020-12-18 MED ORDER — PROMETHAZINE-DM 6.25-15 MG/5ML PO SYRP: 5.0000 mL | ORAL_SOLUTION | Freq: Four times a day (QID) | ORAL | 0 refills | Status: DC | PRN

## 2020-12-18 MED ORDER — PREDNISONE 20 MG PO TABS: 40.0000 mg | ORAL_TABLET | Freq: Every day | ORAL | 0 refills | Status: AC

## 2020-12-18 NOTE — ED Triage Notes (Addendum)
Pt reports was here for cough and chest pains Saturday. Took medications as ordered and still having chest pains (worse with coughing), reports "whooping cough", shortness of breath. Sat 99% on RA. Pt reports seeing bloody tint to the mucus she coughs up.   States in total her symptoms have been ongoing for about 2 weeks. Reports taking tylenol, cough/cold otc meds, nyquil etc. For symptoms with some relief.
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
## 2020-12-18 NOTE — Discharge Instructions (Addendum)
-  Promethazine DM cough syrup for congestion/cough. This could make you drowsy, so take at night before bed. -Tessalon (Benzonatate) as needed for cough. Take one pill up to 3x daily (every 8 hours) -Prednisone, 2 pills taken at the same time for 3 more days.  Try taking this earlier in the day as it can give you energy.  -Seek additional medical attention if you new symptoms like fevers and chills that are getting worse, shortness of breath, wheezing, dizziness, chest pain at rest.

## 2020-12-19 ENCOUNTER — Telehealth (HOSPITAL_COMMUNITY): Payer: Self-pay | Admitting: Student

## 2020-12-19 MED ORDER — DELSYM 15 MG PO TABS: 15.0000 mg | ORAL_TABLET | Freq: Four times a day (QID) | ORAL | 0 refills | Status: DC | PRN

## 2020-12-19 NOTE — Telephone Encounter (Signed)
Medicaid did not cover tessalon or promethazine DM, so I sent Delsym.

## 2021-05-25 ENCOUNTER — Telehealth: Payer: Medicaid Other | Admitting: Nurse Practitioner

## 2021-05-25 ENCOUNTER — Encounter: Payer: Self-pay | Admitting: Nurse Practitioner

## 2021-05-25 DIAGNOSIS — H6690 Otitis media, unspecified, unspecified ear: Secondary | ICD-10-CM

## 2021-05-25 MED ORDER — PREDNISONE 10 MG PO TABS: 10.0000 mg | ORAL_TABLET | Freq: Every day | ORAL | 0 refills | Status: AC

## 2021-05-25 MED ORDER — AMOXICILLIN-POT CLAVULANATE 875-125 MG PO TABS: 1.0000 | ORAL_TABLET | Freq: Two times a day (BID) | ORAL | 0 refills | Status: AC

## 2021-05-25 NOTE — Progress Notes (Signed)
Virtual Visit Consent   Claudia Moon, you are scheduled for a virtual visit with a [facility] provider today.     Just as with appointments in the office, your consent must be obtained to participate.  Your consent will be active for this visit and any virtual visit you may have with one of our providers in the next 365 days.     If you have a MyChart account, a copy of this consent can be sent to you electronically.  All virtual visits are billed to your insurance company just like a traditional visit in the office.    As this is a virtual visit, video technology does not allow for your provider to perform a traditional examination.  This may limit your provider's ability to fully assess your condition.  If your provider identifies any concerns that need to be evaluated in person or the need to arrange testing (such as labs, EKG, etc.), we will make arrangements to do so.     Although advances in technology are sophisticated, we cannot ensure that it will always work on either your end or our end.  If the connection with a video visit is poor, the visit may have to be switched to a telephone visit.  With either a video or telephone visit, we are not always able to ensure that we have a secure connection.     I need to obtain your verbal consent now.   Are you willing to proceed with your visit today?    Claudia Moon has provided verbal consent on 05/25/2021 for a virtual visit (video or telephone).   Claiborne Rigg, NP   Date: 05/25/2021 9:23 AM   Virtual Visit via Video Note   I, Claiborne Rigg, connected with  Claudia Moon  ([id number], Jan 20, 1996) on 05/25/21 at  9:15 AM EST by a video-enabled telemedicine application and verified that I am speaking with the correct person using two identifiers.  Location: Patient: Virtual Visit Location Patient: Home Provider: Virtual Visit Location Provider: Home Office   I discussed the limitations of evaluation and management by  telemedicine and the availability of in person appointments. The patient expressed understanding and agreed to proceed.    History of Present Illness: Claudia Moon is a 25 y.o. who identifies as a female who was assigned female at birth, and is being seen today for ear pain.  HPI:  Ear Pain: Patient presents with left ear pain.  Symptoms include left ear drainage , left ear pain, nasal congestion, and plugged sensation in the left ear. Symptoms began 2 days ago and are unchanged since that time. Patient denies fever and productive cough. Ear history: 0 previous ear infections.   Yesterday was not able to go work due to left Ear pain. She started putting peroxide in the ear and feels this made her symptoms worsen. Feels pain and swelling behind left ear. Denies cough or fever  Problems:  Patient Active Problem List   Diagnosis Date Noted   Intrauterine pregnancy 12/23/2018   VBAC (vaginal birth after Cesarean) 12/23/2018   Obesity during pregnancy, antepartum 08/12/2018   Maternal varicella, non-immune 10/03/2016    Allergies: No Known Allergies Medications:  Current Outpatient Medications:    amoxicillin-clavulanate (AUGMENTIN) 875-125 MG tablet, Take 1 tablet by mouth 2 (two) times daily for 5 days., Disp: 10 tablet, Rfl: 0   predniSONE (DELTASONE) 10 MG tablet, Take 1 tablet (10 mg total) by mouth daily with breakfast for 5 days., Disp: 5  tablet, Rfl: 0  Observations/Objective: Patient is well-developed, well-nourished in no acute distress.  Resting comfortably in bed at home.  Head is normocephalic, atraumatic.  No labored breathing.  Speech is clear and coherent with logical content.  Patient is alert and oriented at baseline.    Assessment and Plan: 1. Acute otitis media, unspecified otitis media type - amoxicillin-clavulanate (AUGMENTIN) 875-125 MG tablet; Take 1 tablet by mouth 2 (two) times daily for 5 days.  Dispense: 10 tablet; Refill: 0 - predniSONE (DELTASONE) 10 MG  tablet; Take 1 tablet (10 mg total) by mouth daily with breakfast for 5 days.  Dispense: 5 tablet; Refill: 0   Follow Up Instructions: I discussed the assessment and treatment plan with the patient. The patient was provided an opportunity to ask questions and all were answered. The patient agreed with the plan and demonstrated an understanding of the instructions.  A copy of instructions were sent to the patient via MyChart unless otherwise noted below.     The patient was advised to call back or seek an in-person evaluation if the symptoms worsen or if the condition fails to improve as anticipated.  Time:  I spent 10 minutes with the patient via telehealth technology discussing the above problems/concerns.    Claiborne Rigg, NP

## 2021-05-25 NOTE — Patient Instructions (Addendum)
  Claudia Moon, thank you for joining Claiborne Rigg, NP for today's virtual visit.  While this provider is not your primary care provider (PCP), if your PCP is located in our provider database this encounter information will be shared with them immediately following your visit.  Consent: [name] provided verbal consent for this virtual visit at the beginning of the encounter.  Current Medications:  Current Outpatient Medications:    amoxicillin-clavulanate (AUGMENTIN) 875-125 MG tablet, Take 1 tablet by mouth 2 (two) times daily for 5 days., Disp: 10 tablet, Rfl: 0   predniSONE (DELTASONE) 10 MG tablet, Take 1 tablet (10 mg total) by mouth daily with breakfast for 5 days., Disp: 5 tablet, Rfl: 0   Medications ordered in this encounter:  Meds ordered this encounter  Medications   amoxicillin-clavulanate (AUGMENTIN) 875-125 MG tablet    Sig: Take 1 tablet by mouth 2 (two) times daily for 5 days.    Dispense:  10 tablet    Refill:  0    Order Specific Question:   Supervising Provider    Answer:   Hyacinth Meeker, BRIAN [[id number]]   predniSONE (DELTASONE) 10 MG tablet    Sig: Take 1 tablet (10 mg total) by mouth daily with breakfast for 5 days.    Dispense:  5 tablet    Refill:  0    Order Specific Question:   Supervising Provider    Answer:   Hyacinth Meeker, BRIAN [[id number]]     *If you need refills on other medications prior to your next appointment, please contact your pharmacy*  Follow-Up: Call back or seek an in-person evaluation if the symptoms worsen or if the condition fails to improve as anticipated.    If you have been instructed to have an in-person evaluation today at a local Urgent Care facility, please use the link below. It will take you to a list of all of our available [facility], including address, phone number and hours of operation. Please do not delay care.  [facility]  If you or a family member do not have a primary care provider, use  the link below to schedule a visit and establish care. When you choose a [facility] primary care physician or advanced practice provider, you gain a long-term partner in health. Find a Primary Care Provider  Learn more about [facility] in-office and virtual care options: [facility] - Get Care Now

## 2021-05-26 ENCOUNTER — Ambulatory Visit (HOSPITAL_COMMUNITY)
Admission: EM | Admit: 2021-05-26 | Discharge: 2021-05-26 | Disposition: A | Discharge status: Home / Self Care | Payer: Medicaid Other | Attending: Urgent Care | Admitting: Urgent Care

## 2021-05-26 ENCOUNTER — Other Ambulatory Visit: Payer: Self-pay

## 2021-05-26 ENCOUNTER — Encounter (HOSPITAL_COMMUNITY): Payer: Self-pay

## 2021-05-26 DIAGNOSIS — H60502 Unspecified acute noninfective otitis externa, left ear: Secondary | ICD-10-CM

## 2021-05-26 MED ORDER — PSEUDOEPHEDRINE HCL 30 MG PO TABS: 30.0000 mg | ORAL_TABLET | Freq: Three times a day (TID) | ORAL | 0 refills | Status: DC | PRN

## 2021-05-26 MED ORDER — CIPROFLOXACIN-DEXAMETHASONE 0.3-0.1 % OT SUSP: 4.0000 [drp] | Freq: Two times a day (BID) | OTIC | 0 refills | Status: DC

## 2021-05-26 MED ORDER — CETIRIZINE HCL 10 MG PO TABS: 10.0000 mg | ORAL_TABLET | Freq: Every day | ORAL | 0 refills | Status: DC

## 2021-05-26 NOTE — ED Triage Notes (Signed)
Pt presents with ear pain and was seen virtually on Friday due to pain and was prescribed antibiotics and prednisone ans she tried a home remedy by putting peroxide in her ear on Friday and now she is having difficulty hearing and pain.
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
## 2021-05-28 ENCOUNTER — Telehealth: Payer: Medicaid Other | Admitting: Physician Assistant

## 2021-05-28 ENCOUNTER — Ambulatory Visit (HOSPITAL_COMMUNITY): Payer: Medicaid Other

## 2021-05-28 DIAGNOSIS — H6983 Other specified disorders of Eustachian tube, bilateral: Secondary | ICD-10-CM

## 2021-05-28 DIAGNOSIS — H60392 Other infective otitis externa, left ear: Secondary | ICD-10-CM

## 2021-05-28 MED ORDER — FLUTICASONE PROPIONATE 50 MCG/ACT NA SUSP: 2.0000 | Freq: Every day | NASAL | 0 refills | Status: DC

## 2021-05-28 NOTE — Progress Notes (Signed)
Virtual Visit Consent   Claudia Moon, you are scheduled for a virtual visit with a [facility] provider today.     Just as with appointments in the office, your consent must be obtained to participate.  Your consent will be active for this visit and any virtual visit you may have with one of our providers in the next 365 days.     If you have a MyChart account, a copy of this consent can be sent to you electronically.  All virtual visits are billed to your insurance company just like a traditional visit in the office.    As this is a virtual visit, video technology does not allow for your provider to perform a traditional examination.  This may limit your provider's ability to fully assess your condition.  If your provider identifies any concerns that need to be evaluated in person or the need to arrange testing (such as labs, EKG, etc.), we will make arrangements to do so.     Although advances in technology are sophisticated, we cannot ensure that it will always work on either your end or our end.  If the connection with a video visit is poor, the visit may have to be switched to a telephone visit.  With either a video or telephone visit, we are not always able to ensure that we have a secure connection.     I need to obtain your verbal consent now.   Are you willing to proceed with your visit today?    Claudia Moon has provided verbal consent on 05/28/2021 for a virtual visit (video or telephone).   Claudia Moon, [place]   Date: 05/28/2021 2:54 PM   Virtual Visit via Video Note   I, Claudia Moon, connected with  Claudia Moon  ([id number], 28-Jul-1995) on 05/28/21 at  2:45 PM EST by a video-enabled telemedicine application and verified that I am speaking with the correct person using two identifiers.  Location: Patient: Virtual Visit Location Patient: Home Provider: Virtual Visit Location Provider: Home Office   I discussed the limitations of evaluation and management  by telemedicine and the availability of in person appointments. The patient expressed understanding and agreed to proceed.    History of Present Illness: Claudia Moon is a 25 y.o. who identifies as a female who was assigned female at birth, and is being seen today for further questions about current treatment for otitis externa of left ear. Was initially seen for this issue via video visit on 05/25/2021 at which time an actual AOM suspected. She was started on Augmentin and prednisone. Symptoms continued to be bothersome so was seen in person at [facility] on 05/26/2021 where she was noted to have an actual AOE of left ear. Was started on ciprodex and some OTC medications for suspected ETD. Notes she is taking the Ciprodex, prednisone, antihistamine and decongestant as directed. Notes that her drainage has decreased. Pain is significant but much improved after starting drops. Still noting pressure behind ears, now bilaterally with some occasional ringing. Denies dizziness. Notes hearing is still muffled of L ear. Notes some popping of ears as well. Denies nasal congestion, head congestion or sinus pain. Denies fevers, chills. Has follow-up with a [facility] on 05/30/2021.  HPI: HPI  Problems:  Patient Active Problem List   Diagnosis Date Noted   Intrauterine pregnancy 12/23/2018   VBAC (vaginal birth after Cesarean) 12/23/2018   Obesity during pregnancy, antepartum 08/12/2018   Maternal varicella, non-immune 10/03/2016  Allergies: No Known Allergies Medications:  Current Outpatient Medications:    amoxicillin-clavulanate (AUGMENTIN) 875-125 MG tablet, Take 1 tablet by mouth 2 (two) times daily for 5 days., Disp: 10 tablet, Rfl: 0   cetirizine (ZYRTEC ALLERGY) 10 MG tablet, Take 1 tablet (10 mg total) by mouth daily., Disp: 30 tablet, Rfl: 0   ciprofloxacin-dexamethasone (CIPRODEX) OTIC suspension, Place 4 drops into the left ear 2 (two) times daily., Disp: 7.5 mL, Rfl: 0   predniSONE  (DELTASONE) 10 MG tablet, Take 1 tablet (10 mg total) by mouth daily with breakfast for 5 days., Disp: 5 tablet, Rfl: 0   pseudoephedrine (SUDAFED) 30 MG tablet, Take 1 tablet (30 mg total) by mouth every 8 (eight) hours as needed for congestion., Disp: 30 tablet, Rfl: 0  Observations/Objective: Patient is well-developed, well-nourished in no acute distress.  Resting comfortably at home.  Head is normocephalic, atraumatic.  No labored breathing. Speech is clear and coherent with logical content.  Patient is alert and oriented at baseline.   Assessment and Plan: 1. Other infective acute otitis externa of left ear Improving slowly but still improving. Will have her continue Ciprodex. She is to resume Augmentin as directed by UC provider and complete entire course at this point. Can use Tylenol if needed for pain.   2. Eustachian tube dysfunction, bilateral Continue antihistamine and decongestant. Will add on Flonase.  Follow-up with ENT on Thursday as scheduled.   Follow Up Instructions: I discussed the assessment and treatment plan with the patient. The patient was provided an opportunity to ask questions and all were answered. The patient agreed with the plan and demonstrated an understanding of the instructions.  A copy of instructions were sent to the patient via MyChart unless otherwise noted below.   The patient was advised to call back or seek an in-person evaluation if the symptoms worsen or if the condition fails to improve as anticipated.  Time:  I spent 12 minutes with the patient via telehealth technology discussing the above problems/concerns.    Claudia Climes, PA-C

## 2021-05-28 NOTE — Patient Instructions (Signed)
  Claudia Moon, thank you for joining Piedad Climes, PA-C for today's virtual visit.  While this provider is not your primary care provider (PCP), if your PCP is located in our provider database this encounter information will be shared with them immediately following your visit.  Consent: [name] provided verbal consent for this virtual visit at the beginning of the encounter.  Current Medications:  Current Outpatient Medications:    fluticasone (FLONASE) 50 MCG/ACT nasal spray, Place 2 sprays into both nostrils daily., Disp: 16 g, Rfl: 0   amoxicillin-clavulanate (AUGMENTIN) 875-125 MG tablet, Take 1 tablet by mouth 2 (two) times daily for 5 days., Disp: 10 tablet, Rfl: 0   cetirizine (ZYRTEC ALLERGY) 10 MG tablet, Take 1 tablet (10 mg total) by mouth daily., Disp: 30 tablet, Rfl: 0   ciprofloxacin-dexamethasone (CIPRODEX) OTIC suspension, Place 4 drops into the left ear 2 (two) times daily., Disp: 7.5 mL, Rfl: 0   predniSONE (DELTASONE) 10 MG tablet, Take 1 tablet (10 mg total) by mouth daily with breakfast for 5 days., Disp: 5 tablet, Rfl: 0   pseudoephedrine (SUDAFED) 30 MG tablet, Take 1 tablet (30 mg total) by mouth every 8 (eight) hours as needed for congestion., Disp: 30 tablet, Rfl: 0   Medications ordered in this encounter:  Meds ordered this encounter  Medications   fluticasone (FLONASE) 50 MCG/ACT nasal spray    Sig: Place 2 sprays into both nostrils daily.    Dispense:  16 g    Refill:  0    Order Specific Question:   Supervising Provider    Answer:   Hyacinth Meeker, BRIAN [[id number]]     *If you need refills on other medications prior to your next appointment, please contact your pharmacy*  Follow-Up: Call back or seek an in-person evaluation if the symptoms worsen or if the condition fails to improve as anticipated.  Other Instructions Please continue the Augmentin and Ciprodex.  Once finished with your Prednisone, start the Flonase spray as  directed. Tylenol if needed for mild pain. Keep hydrated. Continue your antihistamine (Cetirizine/Zyrtec) and decongestant (Sudafed) Follow-up with ENT as scheduled. If new fever, acute worsening of symptoms before ENT appointment, please return to Urgent Care.    If you have been instructed to have an in-person evaluation today at a local Urgent Care facility, please use the link below. It will take you to a list of all of our available [facility], including address, phone number and hours of operation. Please do not delay care.  [facility]  If you or a family member do not have a primary care provider, use the link below to schedule a visit and establish care. When you choose a [facility] primary care physician or advanced practice provider, you gain a long-term partner in health. Find a Primary Care Provider  Learn more about [facility]'s in-office and virtual care options: [facility] - Get Care Now

## 2021-05-29 ENCOUNTER — Ambulatory Visit: Payer: Self-pay

## 2021-05-30 DIAGNOSIS — H6092 Unspecified otitis externa, left ear: Secondary | ICD-10-CM | POA: Diagnosis not present

## 2021-05-30 DIAGNOSIS — F17200 Nicotine dependence, unspecified, uncomplicated: Secondary | ICD-10-CM | POA: Diagnosis not present

## 2021-06-05 ENCOUNTER — Telehealth: Payer: Medicaid Other | Admitting: Nurse Practitioner

## 2021-06-05 DIAGNOSIS — B3731 Acute candidiasis of vulva and vagina: Secondary | ICD-10-CM

## 2021-06-05 MED ORDER — FLUCONAZOLE 150 MG PO TABS: 150.0000 mg | ORAL_TABLET | Freq: Once | ORAL | 0 refills | Status: AC

## 2021-06-05 NOTE — Progress Notes (Signed)
Virtual Visit Consent   Claudia Moon, you are scheduled for a virtual visit with Mary-Margaret Daphine Deutscher, FNP, a [facility] provider, today.     Just as with appointments in the office, your consent must be obtained to participate.  Your consent will be active for this visit and any virtual visit you may have with one of our providers in the next 365 days.     If you have a MyChart account, a copy of this consent can be sent to you electronically.  All virtual visits are billed to your insurance company just like a traditional visit in the office.    As this is a virtual visit, video technology does not allow for your provider to perform a traditional examination.  This may limit your provider's ability to fully assess your condition.  If your provider identifies any concerns that need to be evaluated in person or the need to arrange testing (such as labs, EKG, etc.), we will make arrangements to do so.     Although advances in technology are sophisticated, we cannot ensure that it will always work on either your end or our end.  If the connection with a video visit is poor, the visit may have to be switched to a telephone visit.  With either a video or telephone visit, we are not always able to ensure that we have a secure connection.     I need to obtain your verbal consent now.   Are you willing to proceed with your visit today? YES   Claudia Moon has provided verbal consent on 06/05/2021 for a virtual visit (video or telephone).   Mary-Margaret Daphine Deutscher, FNP   Date: 06/05/2021 9:56 AM   Virtual Visit via Video Note   I, Mary-Margaret Daphine Deutscher, connected with Claudia Moon ([id number], Mar 01, 1996) on 06/05/21 at 10:00 AM EST by a video-enabled telemedicine application and verified that I am speaking with the correct person using two identifiers.  Location: Patient: Virtual Visit Location Patient: Home Provider: Virtual Visit Location Provider: Mobile   I discussed the limitations of  evaluation and management by telemedicine and the availability of in person appointments. The patient expressed understanding and agreed to proceed.    History of Present Illness: Claudia Moon is a 25 y.o. who identifies as a female who was assigned female at birth, and is being seen today for BV.  HPI: Patient state sthat last week she was on antibiotics for an ear infection. She has developed a vaginal discharge with itching and irritation. She went to [facility] and bought some miconazole which has not helped. Cold compresses help some. Intercourse is painful. Constant itching. No foul odor.   Review of Systems  Constitutional: Negative.   Genitourinary: Negative.   Neurological: Negative.   Psychiatric/Behavioral: Negative.     Problems:  Patient Active Problem List   Diagnosis Date Noted   Intrauterine pregnancy 12/23/2018   VBAC (vaginal birth after Cesarean) 12/23/2018   Obesity during pregnancy, antepartum 08/12/2018   Maternal varicella, non-immune 10/03/2016    Allergies: No Known Allergies Medications:  Current Outpatient Medications:    cetirizine (ZYRTEC ALLERGY) 10 MG tablet, Take 1 tablet (10 mg total) by mouth daily., Disp: 30 tablet, Rfl: 0   ciprofloxacin-dexamethasone (CIPRODEX) OTIC suspension, Place 4 drops into the left ear 2 (two) times daily., Disp: 7.5 mL, Rfl: 0   fluticasone (FLONASE) 50 MCG/ACT nasal spray, Place 2 sprays into both nostrils daily., Disp: 16 g, Rfl: 0   pseudoephedrine (SUDAFED) 30 MG  tablet, Take 1 tablet (30 mg total) by mouth every 8 (eight) hours as needed for congestion., Disp: 30 tablet, Rfl: 0  Observations/Objective: Patient is well-developed, well-nourished in no acute distress.  Resting comfortably  at home.  Head is normocephalic, atraumatic.  No labored breathing.  Speech is clear and coherent with logical content.  Patient is alert and oriented at baseline.    Assessment and Plan: Claudia Moon in today with chief  complaint of No chief complaint on file.   1. Vaginal candidiasis No bubble baths No douching Meds ordered this encounter  Medications   fluconazole (DIFLUCAN) 150 MG tablet    Sig: Take 1 tablet (150 mg total) by mouth once for 1 dose.    Dispense:  1 tablet    Refill:  0    Order Specific Question:   Supervising Provider    Answer:   Eber Hong [[id number]]       Follow Up Instructions: I discussed the assessment and treatment plan with the patient. The patient was provided an opportunity to ask questions and all were answered. The patient agreed with the plan and demonstrated an understanding of the instructions.  A copy of instructions were sent to the patient via MyChart.  The patient was advised to call back or seek an in-person evaluation if the symptoms worsen or if the condition fails to improve as anticipated.  Time:  I spent 10 minutes with the patient via telehealth technology discussing the above problems/concerns.    Mary-Margaret Daphine Deutscher, FNP

## 2021-06-05 NOTE — Patient Instructions (Signed)
Vaginal Yeast Infection, Adult °Vaginal yeast infection is a condition that causes vaginal discharge as well as soreness, swelling, and redness (inflammation) of the vagina. This is a common condition. Some women get this infection frequently. °What are the causes? °This condition is caused by a change in the normal balance of the yeast (Candida) and normal bacteria that live in the vagina. This change causes an overgrowth of yeast, which causes the inflammation. °What increases the risk? °The condition is more likely to develop in women who: °Take antibiotic medicines. °Have diabetes. °Take birth control pills. °Are pregnant. °Douche often. °Have a weak body defense system (immune system). °Have been taking steroid medicines for a long time. °Frequently wear tight clothing. °What are the signs or symptoms? °Symptoms of this condition include: °White, thick, creamy vaginal discharge. °Swelling, itching, redness, and irritation of the vagina. The lips of the vagina (labia) may be affected as well. °Pain or a burning feeling while urinating. °Pain during sex. °How is this diagnosed? °This condition is diagnosed based on: °Your medical history. °A physical exam. °A pelvic exam. Your health care provider will examine a sample of your vaginal discharge under a microscope. Your health care provider may send this sample for testing to confirm the diagnosis. °How is this treated? °This condition is treated with medicine. Medicines may be over-the-counter or prescription. You may be told to use one or more of the following: °Medicine that is taken by mouth (orally). °Medicine that is applied as a cream (topically). °Medicine that is inserted directly into the vagina (suppository). °Follow these instructions at home: °Take or apply over-the-counter and prescription medicines only as told by your health care provider. °Do not use tampons until your health care provider approves. °Do not have sex until your infection has  cleared. Sex can prolong or worsen your symptoms of infection. Ask your health care provider when it is safe to resume sexual activity. °Keep all follow-up visits. This is important. °How is this prevented? ° °Do not wear tight clothes, such as pantyhose or tight pants. °Wear breathable cotton underwear. °Do not use douches, perfumed soap, creams, or powders. °Wipe from front to back after using the toilet. °If you have diabetes, keep your blood sugar levels under control. °Ask your health care provider for other ways to prevent yeast infections. °Contact a health care provider if: °You have a fever. °Your symptoms go away and then return. °Your symptoms do not get better with treatment. °Your symptoms get worse. °You have new symptoms. °You develop blisters in or around your vagina. °You have blood coming from your vagina and it is not your menstrual period. °You develop pain in your abdomen. °Summary °Vaginal yeast infection is a condition that causes discharge as well as soreness, swelling, and redness (inflammation) of the vagina. °This condition is treated with medicine. Medicines may be over-the-counter or prescription. °Take or apply over-the-counter and prescription medicines only as told by your health care provider. °Do not douche. Resume sexual activity or use of tampons as instructed by your health care provider. °Contact a health care provider if your symptoms do not get better with treatment or your symptoms go away and then return. °This information is not intended to replace advice given to you by your health care provider. Make sure you discuss any questions you have with your health care provider. °Document Revised: 09/03/2020 Document Reviewed: 09/03/2020 °Elsevier Patient Education © 2022 [employer] ° °

## 2021-07-04 ENCOUNTER — Encounter (HOSPITAL_COMMUNITY): Payer: Self-pay | Admitting: Emergency Medicine

## 2021-07-04 ENCOUNTER — Other Ambulatory Visit: Payer: Self-pay

## 2021-07-04 ENCOUNTER — Ambulatory Visit (HOSPITAL_COMMUNITY)
Admission: EM | Admit: 2021-07-04 | Discharge: 2021-07-04 | Disposition: A | Discharge status: Home / Self Care | Payer: Medicaid Other | Attending: Emergency Medicine | Admitting: Emergency Medicine

## 2021-07-04 DIAGNOSIS — B9789 Other viral agents as the cause of diseases classified elsewhere: Secondary | ICD-10-CM | POA: Insufficient documentation

## 2021-07-04 DIAGNOSIS — J038 Acute tonsillitis due to other specified organisms: Secondary | ICD-10-CM | POA: Diagnosis not present

## 2021-07-04 DIAGNOSIS — Z20822 Contact with and (suspected) exposure to covid-19: Secondary | ICD-10-CM | POA: Insufficient documentation

## 2021-07-04 LAB — POC INFLUENZA A AND B ANTIGEN (URGENT CARE ONLY)
INFLUENZA A ANTIGEN, POC: NEGATIVE
INFLUENZA B ANTIGEN, POC: NEGATIVE

## 2021-07-04 LAB — POCT RAPID STREP A, ED / UC: Streptococcus, Group A Screen (Direct): NEGATIVE

## 2021-07-04 LAB — SARS CORONAVIRUS 2 (TAT 6-24 HRS): SARS Coronavirus 2: NEGATIVE

## 2021-07-04 MED ORDER — LIDOCAINE VISCOUS HCL 2 % MT SOLN: 15.0000 mL | OROMUCOSAL | 0 refills | Status: DC | PRN

## 2021-07-04 MED ORDER — IBUPROFEN 800 MG PO TABS: 800.0000 mg | ORAL_TABLET | Freq: Three times a day (TID) | ORAL | 0 refills | Status: DC

## 2021-07-04 NOTE — Discharge Instructions (Addendum)
Your rapid strep test today was negative, your throat sample has been sent to the lab to see if it will grow bacteria, if this occurs you will be notified and antibiotic be sent in for use  Flu test negative, covid test pending  In the meantime we must treat this as a viral symptom and manage the symptoms  You may gargle and spit lidocaine solution every 4 hours as needed for temporary relief of your sore throat  May use ibuprofen every 8 hours as needed in addition to Tylenol for additional comfort  Keep upcoming ENT appointment

## 2021-07-04 NOTE — ED Triage Notes (Signed)
Symptoms started 07/03/2021.  Complains chills, sore throat, cough.  Patient reports tonsillar stones.
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
## 2021-07-05 ENCOUNTER — Telehealth (HOSPITAL_COMMUNITY): Payer: Self-pay | Admitting: Emergency Medicine

## 2021-07-05 LAB — CULTURE, GROUP A STREP (THRC)

## 2021-07-05 MED ORDER — AZITHROMYCIN 250 MG PO TABS: 250.0000 mg | ORAL_TABLET | Freq: Every day | ORAL | 0 refills | Status: DC

## 2021-07-27 ENCOUNTER — Telehealth: Payer: Medicaid Other | Admitting: Emergency Medicine

## 2021-07-27 DIAGNOSIS — R635 Abnormal weight gain: Secondary | ICD-10-CM

## 2021-07-27 NOTE — Patient Instructions (Addendum)
°  Claudia Moon, thank you for joining [name], PA-C for today's virtual visit.  While this provider is not your primary care provider (PCP), if your PCP is located in our provider database this encounter information will be shared with them immediately following your visit.  Consent: [name] provided verbal consent for this virtual visit at the beginning of the encounter.  Current Medications:  Current Outpatient Medications:    cetirizine (ZYRTEC ALLERGY) 10 MG tablet, Take 1 tablet (10 mg total) by mouth daily. (Patient not taking: Reported on 07/04/2021), Disp: 30 tablet, Rfl: 0   fluticasone (FLONASE) 50 MCG/ACT nasal spray, Place 2 sprays into both nostrils daily. (Patient not taking: Reported on 07/04/2021), Disp: 16 g, Rfl: 0   ibuprofen (ADVIL) 800 MG tablet, Take 1 tablet (800 mg total) by mouth 3 (three) times daily., Disp: 21 tablet, Rfl: 0   pseudoephedrine (SUDAFED) 30 MG tablet, Take 1 tablet (30 mg total) by mouth every 8 (eight) hours as needed for congestion. (Patient not taking: Reported on 07/04/2021), Disp: 30 tablet, Rfl: 0   Medications ordered in this encounter:  No orders of the defined types were placed in this encounter.    *If you need refills on other medications prior to your next appointment, please contact your pharmacy*  Follow-Up: Call back or seek an in-person evaluation if the symptoms worsen or if the condition fails to improve as anticipated.  Other Instructions We do not do weight loss management at this time.  Please follow up with a PCP for further evaluation and management.  If you do not have a PCP you can try to make an appt with [facility] at [facility]   If you have been instructed to have an in-person evaluation today at a local Urgent Care facility, please use the link below. It will take you to a list of all of our available [place] [place] [facility], including address, phone number  and hours of operation. Please do not delay care.  [facility]  If you or a family member do not have a primary care provider, use the link below to schedule a visit and establish care. When you choose a [facility] primary care physician or advanced practice provider, you gain a long-term partner in health. Find a Primary Care Provider  Learn more about [facility]'s in-office and virtual care options: [facility]

## 2021-07-27 NOTE — Progress Notes (Signed)
Virtual Visit Consent   Claudia Moon, you are scheduled for a virtual visit with a [facility] provider today.     Just as with appointments in the office, your consent must be obtained to participate.  Your consent will be active for this visit and any virtual visit you may have with one of our providers in the next 365 days.     If you have a MyChart account, a copy of this consent can be sent to you electronically.  All virtual visits are billed to your insurance company just like a traditional visit in the office.    As this is a virtual visit, video technology does not allow for your provider to perform a traditional examination.  This may limit your provider's ability to fully assess your condition.  If your provider identifies any concerns that need to be evaluated in person or the need to arrange testing (such as labs, EKG, etc.), we will make arrangements to do so.     Although advances in technology are sophisticated, we cannot ensure that it will always work on either your end or our end.  If the connection with a video visit is poor, the visit may have to be switched to a telephone visit.  With either a video or telephone visit, we are not always able to ensure that we have a secure connection.     I need to obtain your verbal consent now.   Are you willing to proceed with your visit today?    Claudia Moon has provided verbal consent on 07/27/2021 for a virtual visit (video or telephone).   Claudia Moon, [place]   Date: 07/27/2021 11:23 AM   Virtual Visit via Video Note   I, Claudia Moon, connected with  Claudia Moon  ([id number], 15-May-1996) on 07/27/21 at 11:15 AM EST by a video-enabled telemedicine application and verified that I am speaking with the correct person using two identifiers.  Location: Patient: Virtual Visit Location Patient: Mobile Provider: Virtual Visit Location Provider: Home Office   I discussed the limitations of evaluation and management by  telemedicine and the availability of in person appointments. The patient expressed understanding and agreed to proceed.    History of Present Illness: Claudia Moon is a 26 y.o. who identifies as a female who was assigned female at birth, and is being seen today for weight-loss medication.  Patient does not have a PCP.  Informed patient we do not management weight-loss in telehealth urgent care.    HPI: HPI  Problems:  Patient Active Problem List   Diagnosis Date Noted   Intrauterine pregnancy 12/23/2018   VBAC (vaginal birth after Cesarean) 12/23/2018   Obesity during pregnancy, antepartum 08/12/2018   Maternal varicella, non-immune 10/03/2016    Allergies: No Known Allergies Medications:  Current Outpatient Medications:    cetirizine (ZYRTEC ALLERGY) 10 MG tablet, Take 1 tablet (10 mg total) by mouth daily. (Patient not taking: Reported on 07/04/2021), Disp: 30 tablet, Rfl: 0   fluticasone (FLONASE) 50 MCG/ACT nasal spray, Place 2 sprays into both nostrils daily. (Patient not taking: Reported on 07/04/2021), Disp: 16 g, Rfl: 0   ibuprofen (ADVIL) 800 MG tablet, Take 1 tablet (800 mg total) by mouth 3 (three) times daily., Disp: 21 tablet, Rfl: 0   pseudoephedrine (SUDAFED) 30 MG tablet, Take 1 tablet (30 mg total) by mouth every 8 (eight) hours as needed for congestion. (Patient not taking: Reported on 07/04/2021), Disp: 30 tablet, Rfl: 0  Observations/Objective: Patient in no acute  distress.  No labored breathing. Speaking in full sentences Speech is clear and coherent with logical content.  Patient is alert and oriented at baseline.   Assessment and Plan: 1. Weight gain   We do not do weight loss management at this time.  Please follow up with a PCP for further evaluation and management.  If you do not have a PCP you can try to make an appt with [facility] at [facility]  Follow Up Instructions: I discussed the assessment and treatment plan  with the patient. The patient was provided an opportunity to ask questions and all were answered. The patient agreed with the plan and demonstrated an understanding of the instructions.  A copy of instructions were sent to the patient via MyChart unless otherwise noted below.   The patient was advised to call back or seek an in-person evaluation if the symptoms worsen or if the condition fails to improve as anticipated.  Time:  I spent 5 minutes with the patient via telehealth technology discussing the above problems/concerns.    Claudia Box, PA-C

## 2021-08-08 ENCOUNTER — Telehealth: Payer: Medicaid Other | Admitting: Physician Assistant

## 2021-08-08 DIAGNOSIS — G43019 Migraine without aura, intractable, without status migrainosus: Secondary | ICD-10-CM

## 2021-08-08 MED ORDER — ONDANSETRON HCL 4 MG PO TABS: 4.0000 mg | ORAL_TABLET | Freq: Three times a day (TID) | ORAL | 0 refills | Status: DC | PRN

## 2021-08-08 MED ORDER — PREDNISONE 10 MG (21) PO TBPK: ORAL_TABLET | ORAL | 0 refills | Status: DC

## 2021-08-08 NOTE — Patient Instructions (Signed)
Claudia GlowShaquana Moon, thank you for joining Claudia LovelessJennifer M Shanekia Latella, PA-C for today's virtual visit.  While this provider is not your primary care provider (PCP), if your PCP is located in our provider database this encounter information will be shared with them immediately following your visit.  Consent: (Patient) Claudia GlowShaquana Stegall provided verbal consent for this virtual visit at the beginning of the encounter.  Current Medications:  Current Outpatient Medications:    ondansetron (ZOFRAN) 4 MG tablet, Take 1 tablet (4 mg total) by mouth every 8 (eight) hours as needed for nausea or vomiting., Disp: 20 tablet, Rfl: 0   predniSONE (STERAPRED UNI-PAK 21 TAB) 10 MG (21) TBPK tablet, 6 day taper; take as directed package instructions, Disp: 21 tablet, Rfl: 0   cetirizine (ZYRTEC ALLERGY) 10 MG tablet, Take 1 tablet (10 mg total) by mouth daily. (Patient not taking: Reported on 07/04/2021), Disp: 30 tablet, Rfl: 0   fluticasone (FLONASE) 50 MCG/ACT nasal spray, Place 2 sprays into both nostrils daily. (Patient not taking: Reported on 07/04/2021), Disp: 16 g, Rfl: 0   ibuprofen (ADVIL) 800 MG tablet, Take 1 tablet (800 mg total) by mouth 3 (three) times daily., Disp: 21 tablet, Rfl: 0   pseudoephedrine (SUDAFED) 30 MG tablet, Take 1 tablet (30 mg total) by mouth every 8 (eight) hours as needed for congestion. (Patient not taking: Reported on 07/04/2021), Disp: 30 tablet, Rfl: 0   Medications ordered in this encounter:  Meds ordered this encounter  Medications   predniSONE (STERAPRED UNI-PAK 21 TAB) 10 MG (21) TBPK tablet    Sig: 6 day taper; take as directed package instructions    Dispense:  21 tablet    Refill:  0    Order Specific Question:   Supervising Provider    Answer:   MILLER, BRIAN [[id number]]   ondansetron (ZOFRAN) 4 MG tablet    Sig: Take 1 tablet (4 mg total) by mouth every 8 (eight) hours as needed for nausea or vomiting.    Dispense:  20 tablet    Refill:  0    Order Specific Question:   Supervising  Provider    Answer:   Hyacinth MeekerMILLER, BRIAN [[id number]]     *If you need refills on other medications prior to your next appointment, please contact your pharmacy*  Follow-Up: Call back or seek an in-person evaluation if the symptoms worsen or if the condition fails to improve as anticipated.  Other Instructions Migraine Headache A migraine headache is a very strong throbbing pain on one side or both sides of your head. This type of headache can also cause other symptoms. It can last from 4 hours to 3 days. Talk with your doctor about what things may bring on (trigger) this condition. What are the causes? The exact cause of this condition is not known. This condition may be triggered or caused by: Drinking alcohol. Smoking. Taking medicines, such as: Medicine used to treat chest pain (nitroglycerin). Birth control pills. Estrogen. Some blood pressure medicines. Eating or drinking certain products. Doing physical activity. Other things that may trigger a migraine headache include: Having a menstrual period. Pregnancy. Hunger. Stress. Not getting enough sleep or getting too much sleep. Weather changes. Tiredness (fatigue). What increases the risk? Being 7325-26 years old. Being female. Having a family history of migraine headaches. Being Caucasian. Having depression or anxiety. Being very overweight. What are the signs or symptoms? A throbbing pain. This pain may: Happen in any area of the head, such as on one side or both sides.  Make it hard to do daily activities. Get worse with physical activity. Get worse around bright lights or loud noises. Other symptoms may include: Feeling sick to your stomach (nauseous). Vomiting. Dizziness. Being sensitive to bright lights, loud noises, or smells. Before you get a migraine headache, you may get warning signs (an aura). An aura may include: Seeing flashing lights or having blind spots. Seeing bright spots, halos, or zigzag lines. Having  tunnel vision or blurred vision. Having numbness or a tingling feeling. Having trouble talking. Having weak muscles. Some people have symptoms after a migraine headache (postdromal phase), such as: Tiredness. Trouble thinking (concentrating). How is this treated? Taking medicines that: Relieve pain. Relieve the feeling of being sick to your stomach. Prevent migraine headaches. Treatment may also include: Having acupuncture. Avoiding foods that bring on migraine headaches. Learning ways to control your body functions (biofeedback). Therapy to help you know and deal with negative thoughts (cognitive behavioral therapy). Follow these instructions at home: Medicines Take over-the-counter and prescription medicines only as told by your doctor. Ask your doctor if the medicine prescribed to you: Requires you to avoid driving or using heavy machinery. Can cause trouble pooping (constipation). You may need to take these steps to prevent or treat trouble pooping: Drink enough fluid to keep your pee (urine) pale yellow. Take over-the-counter or prescription medicines. Eat foods that are high in fiber. These include beans, whole grains, and fresh fruits and vegetables. Limit foods that are high in fat and sugar. These include fried or sweet foods. Lifestyle Do not drink alcohol. Do not use any products that contain nicotine or tobacco, such as cigarettes, e-cigarettes, and chewing tobacco. If you need help quitting, ask your doctor. Get at least 8 hours of sleep every night. Limit and deal with stress. General instructions   Keep a journal to find out what may bring on your migraine headaches. For example, write down: What you eat and drink. How much sleep you get. Any change in what you eat or drink. Any change in your medicines. If you have a migraine headache: Avoid things that make your symptoms worse, such as bright lights. It may help to lie down in a dark, quiet room. Do not  drive or use heavy machinery. Ask your doctor what activities are safe for you. Keep all follow-up visits as told by your doctor. This is important. Contact a doctor if: You get a migraine headache that is different or worse than others you have had. You have more than 15 headache days in one month. Get help right away if: Your migraine headache gets very bad. Your migraine headache lasts longer than 72 hours. You have a fever. You have a stiff neck. You have trouble seeing. Your muscles feel weak or like you cannot control them. You start to lose your balance a lot. You start to have trouble walking. You pass out (faint). You have a seizure. Summary A migraine headache is a very strong throbbing pain on one side or both sides of your head. These headaches can also cause other symptoms. This condition may be treated with medicines and changes to your lifestyle. Keep a journal to find out what may bring on your migraine headaches. Contact a doctor if you get a migraine headache that is different or worse than others you have had. Contact your doctor if you have more than 15 headache days in a month. This information is not intended to replace advice given to you by your health care provider.  Make sure you discuss any questions you have with your health care provider. Document Revised: 10/08/2018 Document Reviewed: 07/29/2018 Elsevier Patient Education  2022 [employer].    If you have been instructed to have an in-person evaluation today at a local Urgent Care facility, please use the link below. It will take you to a list of all of our available [facility], including address, phone number and hours of operation. Please do not delay care.  [facility]  If you or a family member do not have a primary care provider, use the link below to schedule a visit and establish care. When you choose a [facility] primary care physician or advanced practice provider,  you gain a long-term partner in health. Find a Primary Care Provider  Learn more about [facility]'s in-office and virtual care options: [facility]

## 2021-08-08 NOTE — Progress Notes (Signed)
Virtual Visit Consent   Shaletha Humble, you are scheduled for a virtual visit with a [facility] provider today.     Just as with appointments in the office, your consent must be obtained to participate.  Your consent will be active for this visit and any virtual visit you may have with one of our providers in the next 365 days.     If you have a MyChart account, a copy of this consent can be sent to you electronically.  All virtual visits are billed to your insurance company just like a traditional visit in the office.    As this is a virtual visit, video technology does not allow for your provider to perform a traditional examination.  This may limit your provider's ability to fully assess your condition.  If your provider identifies any concerns that need to be evaluated in person or the need to arrange testing (such as labs, EKG, etc.), we will make arrangements to do so.     Although advances in technology are sophisticated, we cannot ensure that it will always work on either your end or our end.  If the connection with a video visit is poor, the visit may have to be switched to a telephone visit.  With either a video or telephone visit, we are not always able to ensure that we have a secure connection.     I need to obtain your verbal consent now.   Are you willing to proceed with your visit today?    Falicity Sheets has provided verbal consent on 08/08/2021 for a virtual visit (video or telephone).   Margaretann Loveless, PA-C   Date: 08/08/2021 3:09 PM   Virtual Visit via Video Note   I, Margaretann Loveless, connected with  Claudia Moon  ([id number], Jul 12, 1995) on 08/08/21 at  3:00 PM EST by a video-enabled telemedicine application and verified that I am speaking with the correct person using two identifiers.  Location: Patient: Virtual Visit Location Patient: Home Provider: Virtual Visit Location Provider: Home Office   I discussed the limitations of evaluation and management by  telemedicine and the availability of in person appointments. The patient expressed understanding and agreed to proceed.    History of Present Illness: Claudia Moon is a 26 y.o. who identifies as a female who was assigned female at birth, and is being seen today for migraine.  HPI: Migraine  This is a new problem. The current episode started yesterday. The problem occurs constantly. The problem has been gradually worsening. The pain is located in the Frontal region. The pain radiates to the upper back. The pain quality is similar to prior headaches. The quality of the pain is described as aching, squeezing, pulsating and sharp. The pain is moderate. Associated symptoms include dizziness, nausea and photophobia. Pertinent negatives include no fever, phonophobia or scalp tenderness. She has tried darkened room and acetaminophen for the symptoms. The treatment provided no relief. Her past medical history is significant for migraine headaches.     Problems:  Patient Active Problem List   Diagnosis Date Noted   Intrauterine pregnancy 12/23/2018   VBAC (vaginal birth after Cesarean) 12/23/2018   Obesity during pregnancy, antepartum 08/12/2018   Maternal varicella, non-immune 10/03/2016    Allergies: No Known Allergies Medications:  Current Outpatient Medications:    ondansetron (ZOFRAN) 4 MG tablet, Take 1 tablet (4 mg total) by mouth every 8 (eight) hours as needed for nausea or vomiting., Disp: 20 tablet, Rfl: 0   predniSONE (  STERAPRED UNI-PAK 21 TAB) 10 MG (21) TBPK tablet, 6 day taper; take as directed package instructions, Disp: 21 tablet, Rfl: 0   cetirizine (ZYRTEC ALLERGY) 10 MG tablet, Take 1 tablet (10 mg total) by mouth daily. (Patient not taking: Reported on 07/04/2021), Disp: 30 tablet, Rfl: 0   fluticasone (FLONASE) 50 MCG/ACT nasal spray, Place 2 sprays into both nostrils daily. (Patient not taking: Reported on 07/04/2021), Disp: 16 g, Rfl: 0   ibuprofen (ADVIL) 800 MG tablet, Take 1  tablet (800 mg total) by mouth 3 (three) times daily., Disp: 21 tablet, Rfl: 0   pseudoephedrine (SUDAFED) 30 MG tablet, Take 1 tablet (30 mg total) by mouth every 8 (eight) hours as needed for congestion. (Patient not taking: Reported on 07/04/2021), Disp: 30 tablet, Rfl: 0  Observations/Objective: Patient is well-developed, well-nourished in no acute distress.  Resting comfortably at home.  Head is normocephalic, atraumatic.  No labored breathing.  Speech is clear and coherent with logical content.  Patient is alert and oriented at baseline.    Assessment and Plan: 1. Intractable migraine without aura and without status migrainosus - predniSONE (STERAPRED UNI-PAK 21 TAB) 10 MG (21) TBPK tablet; 6 day taper; take as directed package instructions  Dispense: 21 tablet; Refill: 0 - ondansetron (ZOFRAN) 4 MG tablet; Take 1 tablet (4 mg total) by mouth every 8 (eight) hours as needed for nausea or vomiting.  Dispense: 20 tablet; Refill: 0  - Intractable migraine not resolving with tylenol or rest like normal. - Prednisone added - Zofran for nausea - Push fluids - Seek in person evaluation if symptoms worsen or fail to improve  Follow Up Instructions: I discussed the assessment and treatment plan with the patient. The patient was provided an opportunity to ask questions and all were answered. The patient agreed with the plan and demonstrated an understanding of the instructions.  A copy of instructions were sent to the patient via MyChart unless otherwise noted below.   The patient was advised to call back or seek an in-person evaluation if the symptoms worsen or if the condition fails to improve as anticipated.  Time:  I spent 12 minutes with the patient via telehealth technology discussing the above problems/concerns.    Margaretann Loveless, PA-C

## 2021-08-12 ENCOUNTER — Encounter (HOSPITAL_BASED_OUTPATIENT_CLINIC_OR_DEPARTMENT_OTHER): Payer: Self-pay

## 2021-08-12 ENCOUNTER — Other Ambulatory Visit: Payer: Self-pay

## 2021-08-12 ENCOUNTER — Encounter (HOSPITAL_BASED_OUTPATIENT_CLINIC_OR_DEPARTMENT_OTHER): Payer: Self-pay | Admitting: Family Medicine

## 2021-08-12 ENCOUNTER — Ambulatory Visit (HOSPITAL_BASED_OUTPATIENT_CLINIC_OR_DEPARTMENT_OTHER): Payer: Medicaid Other | Admitting: Family Medicine

## 2021-08-12 DIAGNOSIS — R635 Abnormal weight gain: Secondary | ICD-10-CM | POA: Insufficient documentation

## 2021-08-12 DIAGNOSIS — Z600 Problems of adjustment to life-cycle transitions: Secondary | ICD-10-CM

## 2021-08-12 NOTE — Assessment & Plan Note (Signed)
Weight management evaluation and plan as above
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
## 2021-08-12 NOTE — Assessment & Plan Note (Signed)
PHQ-9 with score of 14 today Patient likely with underlying depression, discussed treatment options today including pharmacotherapy and on pharmacotherapy options She is interested in referral to counseling/therapy at this time, referral placed She wishes to hold off on pharmacotherapy at present, unfortunately first-line therapy is likely to further hamper weight loss goals.  Will need to be considered of this when trying to address weight management

## 2021-08-12 NOTE — Patient Instructions (Signed)
°  Medication Instructions:  Your physician recommends that you continue on your current medications as directed. Please refer to the Current Medication list given to you today. --If you need a refill on any your medications before your next appointment, please call your pharmacy first. If no refills are authorized on file call the office.-- Lab Work: Your physician has recommended that you have lab work today: CBC, CMP, Lipid, A1C, and TSH If you have labs (blood work) drawn today and your tests are completely normal, you will receive your results via [facility] a phone call from our staff.  Please ensure you check your voicemail in the event that you authorized detailed messages to be left on a delegated number. If you have any lab test that is abnormal or we need to change your treatment, we will call you to review the results.  Referrals/Procedures/Imaging: A referral has been placed for you to [facility] for evaluation and treatment. Someone from the scheduling department will be in contact with you in regards to coordinating your consultation. If you do not hear from any of the schedulers within 7-10 business days please give their office a call.  Follow-Up: Your next appointment:   Your physician recommends that you schedule a follow-up appointment in: 4-6 WEEKS with Dr. de [place]  You will receive a text message or e-mail with a link to a survey about your care and experience with [place] today! We would greatly appreciate your feedback!   Thanks for letting us be apart of your health journey!!  Primary Care and Sports Medicine   Dr. Arlina Robes [place]   We encourage you to activate your patient portal called "MyChart".  Sign up information is provided on this After Visit Summary.  MyChart is used to connect with patients for Virtual Visits (Telemedicine).  Patients are able to view lab/test results, encounter notes, upcoming appointments, etc.  Non-urgent messages can be sent to  your provider as well. To learn more about what you can do with MyChart, please visit --  [email or web address].

## 2021-08-12 NOTE — Assessment & Plan Note (Signed)
We will check baseline labs including A1c, TSH, screening for any underlying metabolic issues or thyroid dysfunction Additional labs as below Discussed with patient weight loss measures including lifestyle modifications, which she has already been working on, pharmacotherapy options, although she does indicate that she spoke with her insurance and they reportedly do not cover weight loss medications Did discuss that another option may be meeting with healthy weight and wellness clinic We will await findings from lab results and then decide how to proceed from there

## 2021-08-13 LAB — COMPREHENSIVE METABOLIC PANEL
ALT: 18 IU/L (ref 0–32)
AST: 18 IU/L (ref 0–40)
Albumin/Globulin Ratio: 1.5 (ref 1.2–2.2)
Albumin: 4.3 g/dL (ref 3.9–5.0)
Alkaline Phosphatase: 83 IU/L (ref 44–121)
BUN/Creatinine Ratio: 18 (ref 9–23)
BUN: 11 mg/dL (ref 6–20)
Bilirubin Total: 0.4 mg/dL (ref 0.0–1.2)
CO2: 19 mmol/L — ABNORMAL LOW (ref 20–29)
Calcium: 9.1 mg/dL (ref 8.7–10.2)
Chloride: 101 mmol/L (ref 96–106)
Creatinine, Ser: 0.62 mg/dL (ref 0.57–1.00)
Globulin, Total: 2.9 g/dL (ref 1.5–4.5)
Glucose: 81 mg/dL (ref 70–99)
Potassium: 4.1 mmol/L (ref 3.5–5.2)
Sodium: 134 mmol/L (ref 134–144)
Total Protein: 7.2 g/dL (ref 6.0–8.5)
eGFR: 127 mL/min/{1.73_m2} (ref >59)

## 2021-08-13 LAB — CBC WITH DIFFERENTIAL/PLATELET
Basophils Absolute: 0 10*3/uL (ref 0.0–0.2)
Basos: 0 %
EOS (ABSOLUTE): 0.2 10*3/uL (ref 0.0–0.4)
Eos: 3 %
Hematocrit: 42.7 % (ref 34.0–46.6)
Hemoglobin: 14.3 g/dL (ref 11.1–15.9)
Immature Grans (Abs): 0 10*3/uL (ref 0.0–0.1)
Immature Granulocytes: 0 %
Lymphocytes Absolute: 2.6 10*3/uL (ref 0.7–3.1)
Lymphs: 38 %
MCH: 30.4 pg (ref 26.6–33.0)
MCHC: 33.5 g/dL (ref 31.5–35.7)
MCV: 91 fL (ref 79–97)
Monocytes Absolute: 0.6 10*3/uL (ref 0.1–0.9)
Monocytes: 8 %
Neutrophils Absolute: 3.5 10*3/uL (ref 1.4–7.0)
Neutrophils: 51 %
Platelets: 303 10*3/uL (ref 150–450)
RBC: 4.7 x10E6/uL (ref 3.77–5.28)
RDW: 13.1 % (ref 11.7–15.4)
WBC: 6.9 10*3/uL (ref 3.4–10.8)

## 2021-08-13 LAB — LIPID PANEL
Chol/HDL Ratio: 4.3 ratio (ref 0.0–4.4)
Cholesterol, Total: 179 mg/dL (ref 100–199)
HDL: 42 mg/dL (ref >39)
LDL Chol Calc (NIH): 127 mg/dL — ABNORMAL HIGH (ref 0–99)
Triglycerides: 50 mg/dL (ref 0–149)
VLDL Cholesterol Cal: 10 mg/dL (ref 5–40)

## 2021-08-13 LAB — HEMOGLOBIN A1C
Est. average glucose Bld gHb Est-mCnc: 105 mg/dL
Hgb A1c MFr Bld: 5.3 % (ref 4.8–5.6)

## 2021-08-13 LAB — TSH RFX ON ABNORMAL TO FREE T4: TSH: 1.72 u[IU]/mL (ref 0.450–4.500)

## 2021-08-27 ENCOUNTER — Ambulatory Visit (HOSPITAL_BASED_OUTPATIENT_CLINIC_OR_DEPARTMENT_OTHER): Payer: Medicaid Other | Admitting: Family Medicine

## 2021-09-02 ENCOUNTER — Telehealth: Payer: Medicaid Other | Admitting: Nurse Practitioner

## 2021-09-02 DIAGNOSIS — J069 Acute upper respiratory infection, unspecified: Secondary | ICD-10-CM

## 2021-09-02 MED ORDER — ALBUTEROL SULFATE HFA 108 (90 BASE) MCG/ACT IN AERS: 2.0000 | INHALATION_SPRAY | Freq: Four times a day (QID) | RESPIRATORY_TRACT | 0 refills | Status: DC | PRN

## 2021-09-02 NOTE — Progress Notes (Signed)
?Virtual Visit Consent  ? ?Claudia Moon, you are scheduled for a virtual visit with a [facility] provider today.   ?  ?Just as with appointments in the office, your consent must be obtained to participate.  Your consent will be active for this visit and any virtual visit you may have with one of our providers in the next 365 days.   ?  ?If you have a MyChart account, a copy of this consent can be sent to you electronically.  All virtual visits are billed to your insurance company just like a traditional visit in the office.   ? ?As this is a virtual visit, video technology does not allow for your provider to perform a traditional examination.  This may limit your provider's ability to fully assess your condition.  If your provider identifies any concerns that need to be evaluated in person or the need to arrange testing (such as labs, EKG, etc.), we will make arrangements to do so.   ?  ?Although advances in technology are sophisticated, we cannot ensure that it will always work on either your end or our end.  If the connection with a video visit is poor, the visit may have to be switched to a telephone visit.  With either a video or telephone visit, we are not always able to ensure that we have a secure connection.    ? ?I need to obtain your verbal consent now.   Are you willing to proceed with your visit today?  ?  ?[name] has provided verbal consent on 09/02/2021 for a virtual visit (video or telephone). ?  ?Claudia Simas, FNP  ? ?Date: 09/02/2021 7:01 PM ? ? ?Virtual Visit via Video Note  ? ?IViviano Moon, connected with  Baya Lentz  ([id number], 01-21-1996) on 09/02/21 at  7:00 PM EST by a video-enabled telemedicine application and verified that I am speaking with the correct person using two identifiers. ? ?Location: ?Patient: Virtual Visit Location Patient: Home ?Provider: Virtual Visit Location Provider: Home Office ?  ?I discussed the limitations of evaluation and management by telemedicine and  the availability of in person appointments. The patient expressed understanding and agreed to proceed.   ? ?History of Present Illness: ?Claudia Moon is a 26 y.o. who identifies as a female who was assigned female at birth, and is being seen today with complaints of loss of voice, fever, cough for the past 24 hours.  ? ?She has had a flu vaccine this year ?She has not taken a COVID test yet.  ? ?She has been using some cold/flu over the counter medications for relief.  ? ?Her children have had similar symptoms without diagnosis of COVID. ?  ?Children are 2 and 4 and not in daycare.  ? ?She does have a history of asthma  ? ?Problems:  ?Patient Active Problem List  ? Diagnosis Date Noted  ? Abnormal weight gain 08/12/2021  ? Severe obesity (BMI >= 40) (HCC) 08/12/2021  ? Phase of life problem 08/12/2021  ? Intrauterine pregnancy 12/23/2018  ? VBAC (vaginal birth after Cesarean) 12/23/2018  ? Obesity during pregnancy, antepartum 08/12/2018  ? Maternal varicella, non-immune 10/03/2016  ?  ?Allergies: No Known Allergies ?Medications: No current outpatient medications on file. ? ?Observations/Objective: ?Patient is well-developed, well-nourished in no acute distress.  ?Resting comfortably at home.  ?Head is normocephalic, atraumatic.  ?No labored breathing.  ?Speech is clear and coherent with logical content.  ?Patient is alert and oriented at baseline.  ? ? ?  Assessment and Plan: ?1. Viral upper respiratory tract infection ? ?- albuterol (VENTOLIN HFA) 108 (90 Base) MCG/ACT inhaler; Inhale 2 puffs into the lungs every 6 (six) hours as needed for wheezing or shortness of breath.  Dispense: 8 g; Refill: 0 ?   ? ?Follow Up Instructions: ?I discussed the assessment and treatment plan with the patient. The patient was provided an opportunity to ask questions and all were answered. The patient agreed with the plan and demonstrated an understanding of the instructions.  A copy of instructions were sent to the patient via  MyChart unless otherwise noted below.  ? ? ?The patient was advised to call back or seek an in-person evaluation if the symptoms worsen or if the condition fails to improve as anticipated. ? ?Time:  ?I spent 10 minutes with the patient via telehealth technology discussing the above problems/concerns.   ? ?Claudia Simas, FNP  ?

## 2021-09-03 ENCOUNTER — Telehealth: Payer: Medicaid Other | Admitting: Nurse Practitioner

## 2021-09-03 DIAGNOSIS — J40 Bronchitis, not specified as acute or chronic: Secondary | ICD-10-CM

## 2021-09-03 MED ORDER — AZITHROMYCIN 250 MG PO TABS: ORAL_TABLET | ORAL | 0 refills | Status: AC

## 2021-09-03 NOTE — Progress Notes (Signed)
?Virtual Visit Consent  ? ?[name], you are scheduled for a virtual visit with a [name] today.   ?  ?Just as with appointments in the office, your consent must be obtained to participate.  Your consent will be active for this visit and any virtual visit you may have with one of our providers in the next 365 days.   ?  ?If you have a MyChart account, a copy of this consent can be sent to you electronically.  All virtual visits are billed to your insurance company just like a traditional visit in the office.   ? ?As this is a virtual visit, video technology does not allow for your provider to perform a traditional examination.  This may limit your provider's ability to fully assess your condition.  If your provider identifies any concerns that need to be evaluated in person or the need to arrange testing (such as labs, EKG, etc.), we will make arrangements to do so.   ?  ?Although advances in technology are sophisticated, we cannot ensure that it will always work on either your end or our end.  If the connection with a video visit is poor, the visit may have to be switched to a telephone visit.  With either a video or telephone visit, we are not always able to ensure that we have a secure connection.    ? ?I need to obtain your verbal consent now.   Are you willing to proceed with your visit today?  ?  ?Claudia Moon has provided verbal consent on 09/03/2021 for a virtual visit (video or telephone). ?  ?[name], FNP  ? ?Date: 09/03/2021 6:58 PM ? ? ?Virtual Visit via Video Note  ? ?IApolonio Schneiders, connected with  Claudia Moon  ([id number], 1996-05-25) on 09/03/21 at  7:00 PM EST by a video-enabled telemedicine application and verified that I am speaking with the correct person using two identifiers. ? ?Location: ?Patient: Virtual Visit Location Patient: Home ?Provider: Virtual Visit Location Provider: Home Office ?  ?I discussed the limitations of evaluation and management by telemedicine and  the availability of in person appointments. The patient expressed understanding and agreed to proceed.   ? ?History of Present Illness: ?Claudia Moon is a 26 y.o. who identifies as a female who was assigned female at birth, and is being seen today for ongoing chest congestion for the past week. She started using her inhaler yesterday with some improvement.  ? ?She continues to have a fever as well.  ? ?She has not been able to take a COVID test at this point.  ? ?She has a history of asthma  ?Her cough is productive now as well  ? ?She does not know if she has had COVID in the past  ?She is fully vaccinated  ? ?Problems:  ?Patient Active Problem List  ? Diagnosis Date Noted  ? Abnormal weight gain 08/12/2021  ? Severe obesity (BMI >= 40) ([place]) 08/12/2021  ? Phase of life problem 08/12/2021  ? Intrauterine pregnancy 12/23/2018  ? VBAC (vaginal birth after Cesarean) 12/23/2018  ? Obesity during pregnancy, antepartum 08/12/2018  ? Maternal varicella, non-immune 10/03/2016  ?  ?Allergies: No Known Allergies ?Medications:  ?Current Outpatient Medications:  ?  albuterol (VENTOLIN HFA) 108 (90 Base) MCG/ACT inhaler, Inhale 2 puffs into the lungs every 6 (six) hours as needed for wheezing or shortness of breath., Disp: 8 g, Rfl: 0 ? ?Observations/Objective: ?Patient is well-developed, well-nourished in no acute distress.  ?  Resting comfortably  at home.  ?Head is normocephalic, atraumatic.  ?No labored breathing.  ?Speech is clear and coherent with logical content.  ?Patient is alert and oriented at baseline.  ? ? ?Assessment and Plan: ?1. Bronchitis ? ?- azithromycin (ZITHROMAX) 250 MG tablet; Take 2 tablets on day 1, then 1 tablet daily on days 2 through 5  Dispense: 6 tablet; Refill: 0 ?   ?Continue Albuterol as needed ?Continue Mucinex OTC  ? ?Follow Up Instructions: ?I discussed the assessment and treatment plan with the patient. The patient was provided an opportunity to ask questions and all were answered. The  patient agreed with the plan and demonstrated an understanding of the instructions.  A copy of instructions were sent to the patient via MyChart unless otherwise noted below.  ? ?The patient was advised to call back or seek an in-person evaluation if the symptoms worsen or if the condition fails to improve as anticipated. ? ?Time:  ?I spent 15 minutes with the patient via telehealth technology discussing the above problems/concerns.   ? ?[name], FNP  ?

## 2021-09-17 ENCOUNTER — Ambulatory Visit (HOSPITAL_BASED_OUTPATIENT_CLINIC_OR_DEPARTMENT_OTHER): Payer: Medicaid Other | Admitting: Family Medicine

## 2021-09-26 ENCOUNTER — Encounter (HOSPITAL_BASED_OUTPATIENT_CLINIC_OR_DEPARTMENT_OTHER): Payer: Self-pay | Admitting: Family Medicine

## 2021-10-10 ENCOUNTER — Ambulatory Visit: Payer: Medicaid Other | Admitting: Gastroenterology

## 2021-10-25 ENCOUNTER — Ambulatory Visit (HOSPITAL_COMMUNITY): Payer: Medicaid Other

## 2021-10-26 ENCOUNTER — Ambulatory Visit (HOSPITAL_COMMUNITY)
Admission: EM | Admit: 2021-10-26 | Discharge: 2021-10-26 | Disposition: A | Discharge status: Home / Self Care | Payer: Medicaid Other

## 2021-10-26 ENCOUNTER — Encounter (HOSPITAL_COMMUNITY): Payer: Self-pay

## 2021-10-26 DIAGNOSIS — K625 Hemorrhage of anus and rectum: Secondary | ICD-10-CM

## 2021-10-26 NOTE — ED Triage Notes (Signed)
Pt presents with rectal bleeding and abdominal cramping X 2 days. ?

## 2021-10-26 NOTE — ED Provider Notes (Signed)
Patient presents to urgent care today complaining of 2 days of rectal bleeding and suprapubic cramping.  Patient states the rectal bleeding is painless.  Patient denies known hemorrhoid.  Patient states that her father died from colon cancer.  Patient was advised to contact her primary care provider to schedule colonoscopy as soon as possible. ?  ?Lynden Oxford Scales, PA-C ?10/26/21 1301 ? ?

## 2021-10-26 NOTE — Discharge Instructions (Addendum)
Please follow-up with your primary care provider as soon as possible to discuss colonoscopy. ?

## 2021-11-14 ENCOUNTER — Ambulatory Visit (INDEPENDENT_AMBULATORY_CARE_PROVIDER_SITE_OTHER): Payer: Medicaid Other | Admitting: Licensed Clinical Social Worker

## 2021-11-14 ENCOUNTER — Encounter (HOSPITAL_COMMUNITY): Payer: Self-pay

## 2021-11-14 ENCOUNTER — Encounter (HOSPITAL_COMMUNITY): Payer: Self-pay | Admitting: Licensed Clinical Social Worker

## 2021-11-14 DIAGNOSIS — F1994 Other psychoactive substance use, unspecified with psychoactive substance-induced mood disorder: Secondary | ICD-10-CM

## 2021-11-14 DIAGNOSIS — F1210 Cannabis abuse, uncomplicated: Secondary | ICD-10-CM | POA: Insufficient documentation

## 2021-11-14 HISTORY — DX: Cannabis abuse, uncomplicated: F12.10

## 2021-11-14 NOTE — Plan of Care (Signed)
Pt agreeable to plan  ?
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
## 2021-11-20 ENCOUNTER — Ambulatory Visit (INDEPENDENT_AMBULATORY_CARE_PROVIDER_SITE_OTHER): Payer: Medicaid Other | Admitting: Physician Assistant

## 2021-11-20 ENCOUNTER — Encounter (HOSPITAL_COMMUNITY): Payer: Self-pay | Admitting: Physician Assistant

## 2021-11-20 ENCOUNTER — Ambulatory Visit (INDEPENDENT_AMBULATORY_CARE_PROVIDER_SITE_OTHER): Payer: Medicaid Other | Admitting: Licensed Clinical Social Worker

## 2021-11-20 DIAGNOSIS — G479 Sleep disorder, unspecified: Secondary | ICD-10-CM

## 2021-11-20 DIAGNOSIS — F411 Generalized anxiety disorder: Secondary | ICD-10-CM | POA: Diagnosis not present

## 2021-11-20 DIAGNOSIS — F331 Major depressive disorder, recurrent, moderate: Secondary | ICD-10-CM

## 2021-11-20 DIAGNOSIS — F1994 Other psychoactive substance use, unspecified with psychoactive substance-induced mood disorder: Secondary | ICD-10-CM | POA: Diagnosis not present

## 2021-11-20 HISTORY — DX: Generalized anxiety disorder: F41.1

## 2021-11-20 MED ORDER — TRAZODONE HCL 50 MG PO TABS: 50.0000 mg | ORAL_TABLET | Freq: Every evening | ORAL | 1 refills | Status: AC | PRN

## 2021-11-20 MED ORDER — FLUOXETINE HCL 10 MG PO CAPS: 10.0000 mg | ORAL_CAPSULE | Freq: Every day | ORAL | 1 refills | Status: AC

## 2021-11-20 NOTE — Plan of Care (Signed)
  Problem: Anxiety Disorder CCP Problem  1 Substance use mood disorder  Goal:  identify 3 trigger for anxiety  Outcome: Progressing Goal: create 3 coping skills to decrease marijuana use which is daily  Outcome: Progressing   Problem: Depression CCP Problem  1 MDD  Goal: STG: Claudia Moon WILL PARTICIPATE IN AT LEAST 80% OF SCHEDULED INDIVIDUAL PSYCHOTHERAPY SESSIONS Outcome: Progressing Goal: STG: Claudia Moon WILL COMPLETE AT LEAST 80% OF ASSIGNED HOMEWORK Outcome: Progressing   Problem: Anxiety Disorder CCP Problem  1 Substance use mood disorder  Goal: walk 3 x weekly  Outcome: Not Progressing Goal: LTG: Patient will score less than 5 on the Generalized Anxiety Disorder 7 Scale (GAD-7) Outcome: Not Progressing Goal: STG: Patient will attend at least 80% of scheduled group psychotherapy sessions Outcome: Not Progressing Goal: STG: Patient will complete at least 80% of assigned homework Outcome: Not Progressing   Problem: Depression CCP Problem  1 MDD  Goal: LTG: Claudia Moon WILL SCORE LESS THAN 10 ON THE PATIENT HEALTH QUESTIONNAIRE (PHQ-9) Outcome: Not Progressing
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
## 2021-11-20 NOTE — Progress Notes (Signed)
   THERAPIST PROGRESS NOTE  Virtual Visit via Video Note  I connected with Claudia Moon on 11/20/21 at  9:00 AM EDT by a video enabled telemedicine application and verified that I am speaking with the correct person using two identifiers.  Location: Patient: [facility]  Provider: [facility]    I discussed the limitations of evaluation and management by telemedicine and the availability of in person appointments. The patient expressed understanding and agreed to proceed.      I discussed the assessment and treatment plan with the patient. The patient was provided an opportunity to ask questions and all were answered. The patient agreed with the plan and demonstrated an understanding of the instructions.   The patient was advised to call back or seek an in-person evaluation if the symptoms worsen or if the condition fails to improve as anticipated.  I provided 40 minutes of non-face-to-face time during this encounter.   Dory Horn, LCSW   Participation Level: Active  Behavioral Response: CasualAlertAnxious and Depressed  Type of Therapy: Individual Therapy  Treatment Goals addressed: identify 3 coping skills to decrease marijuana use   ProgressTowards Goals: Initial  Interventions: CBT, Motivational Interviewing, and Supportive  Summary: Claudia Moon is a 26 y.o. female who presents with anxious and depressed mood\affect..  Patient was pleasant, cooperative, maintained good eye contact.  She engaged well in therapy session was dressed casually.  Patient reports primary stressors as marijuana use, financials, and housing.  Patient reports that she is currently staying in a shelter with her 2 children as she has obtained full-time employment and has started to get a regular basis and come.  Patient reports decreasing marijuana use from 5 times daily to 3 times daily.  This is progress in the past week as patient and LCSW did the comprehensive clinical  assessment last week Thursday.  LCSW spoke with patient about triggers for marijuana use patient reports stepdad murder, grief and loss of grandparents, and poor coping skills.   Suicidal/Homicidal: Nowithout intent/plan  Therapist Response:   Intervention/Plan: LCSW utilized interventions for CBT, supportive therapy, person centered therapy, and motivational interviewing.  LCSW utilized positive affirmations, open-ended questions and reflective listening in today's session.  LCSW educated patient on side effects of taking miracle Wanna and an increase on depression and anxiety.  LCSW educated patient on prioritizing stabilization with FDA approved drug through a prescriber at [facility].  Plan for patient is to walk 1 time weekly for 15 minutes.  LCSW also spoke with patient about a "accountability journal".  This is to track the use of marijuana on a daily basis.  Plan: Return again in 4 weeks.  Diagnosis: No diagnosis found.  Collaboration of Care: Other none today   Patient/Guardian was advised Release of Information must be obtained prior to any record release in order to collaborate their care with an outside provider. Patient/Guardian was advised if they have not already done so to contact the registration department to sign all necessary forms in order for [place] to release information regarding their care.   Consent: Patient/Guardian gives verbal consent for treatment and assignment of benefits for services provided during this visit. Patient/Guardian expressed understanding and agreed to proceed.   Dory Horn, LCSW 11/20/2021

## 2021-11-22 ENCOUNTER — Encounter (HOSPITAL_COMMUNITY): Payer: Self-pay | Admitting: Physician Assistant

## 2021-12-11 ENCOUNTER — Ambulatory Visit (INDEPENDENT_AMBULATORY_CARE_PROVIDER_SITE_OTHER): Payer: Medicaid Other | Admitting: Licensed Clinical Social Worker

## 2021-12-11 DIAGNOSIS — F1994 Other psychoactive substance use, unspecified with psychoactive substance-induced mood disorder: Secondary | ICD-10-CM

## 2021-12-11 NOTE — Plan of Care (Signed)
  Problem: Anxiety Disorder CCP Problem  1 Substance use mood disorder  Goal:  identify 3 trigger for anxiety  Outcome: Progressing Goal: walk 3 x weekly  Outcome: Progressing Goal: create 3 coping skills to decrease marijuana use which is daily  Outcome: Progressing Goal: LTG: Patient will score less than 5 on the Generalized Anxiety Disorder 7 Scale (GAD-7) Outcome: Progressing Goal: STG: Patient will attend at least 80% of scheduled group psychotherapy sessions Outcome: Progressing Goal: STG: Patient will complete at least 80% of assigned homework Outcome: Progressing   Problem: Depression CCP Problem  1 MDD  Goal: LTG: Reagen WILL SCORE LESS THAN 10 ON THE PATIENT HEALTH QUESTIONNAIRE (PHQ-9) Outcome: Progressing Goal: STG: Lexine WILL PARTICIPATE IN AT LEAST 80% OF SCHEDULED INDIVIDUAL PSYCHOTHERAPY SESSIONS Outcome: Progressing Goal: STG: Harlie WILL COMPLETE AT LEAST 80% OF ASSIGNED HOMEWORK Outcome: Progressing   Problem: Anxiety Disorder CCP Problem  1 Substance use mood disorder  Intervention: Review results of GAD-7 with the patient to track progress   Problem: Depression CCP Problem  1 MDD  Intervention: Administer the PHQ-9 or MADRS weekly for 4 weeks Intervention: PROVIDE Ameirah WITH EDUCATIONAL INFORMATION AND READING MATERIAL ON DISSOCIATION, ITS CAUSES, AND SYMPTOMS Intervention: WORK WITH Eleana TO IDENTIFY THE MAJOR COMPONENTS OF A RECENT EPISODE OF DEPRESSION: PHYSICAL SYMPTOMS, MAJOR THOUGHTS AND IMAGES, AND MAJOR BEHAVIORS THEY EXPERIENCED

## 2021-12-11 NOTE — Progress Notes (Signed)
   THERAPIST PROGRESS NOTE  Session Time: 30  Participation Level: Active  Behavioral Response: CasualAlertAnxious and Depressed  Type of Therapy: Individual Therapy  Treatment Goals addressed: Goal:  identify 3 trigger for anxiety  Outcome: Progressing  ProgressTowards Goals: Progressing  Interventions: CBT, Motivational Interviewing, and Supportive  Summary: Claudia Moon is a 26 y.o. female who presents with depressed and anxious mood\affect.  Patient was pleasant, cooperative, maintained good eye contact.  Patient was alert and oriented x5 and engaged well in therapy session.  Patient reports primary stressors as financials, housing, and work.  Patient reports that she has had a job for the last 60 days but people keep getting let go from her work.  Patient reports that she is starting to get nervous as if she needs to start looking for another job.  Other stressors include housing as she has 1 month left at the shelter that she is currently staying at.  Patient reports that she has had no leads at this time.  Patient reports that she is going to apply for housing with her boyfriend but is fearful of getting denied.  Suicidal/Homicidal: Nowithout intent/plan  Therapist Response:    Intervention/Plan: LCSW administered a GAD-7.  LCSW administered the PHQ-9.  LCSW notes a decrease in PHQ-9 and GAD-7 since last were taken.  LCSW educated patient on GAD-7 and PHQ-9 scores.  LCSW educated patient on Maslow's hierarchy of needs.  LCSW used supportive language for praise and encouragement.  LCSW used person centered therapy for empowerment.  Plan: Return again in 4 weeks.  Diagnosis: No diagnosis found.  Collaboration of Care: Other None today.   Patient/Guardian was advised Release of Information must be obtained prior to any record release in order to collaborate their care with an outside provider. Patient/Guardian was advised if they have not already done so to contact the  registration department to sign all necessary forms in order for [place] to release information regarding their care.   Consent: Patient/Guardian gives verbal consent for treatment and assignment of benefits for services provided during this visit. Patient/Guardian expressed understanding and agreed to proceed.   Weber Cooks, LCSW 12/11/2021

## 2021-12-25 ENCOUNTER — Telehealth (HOSPITAL_COMMUNITY): Payer: Medicaid Other | Admitting: Psychiatry

## 2022-01-02 ENCOUNTER — Encounter (HOSPITAL_COMMUNITY): Payer: Self-pay

## 2022-01-02 ENCOUNTER — Ambulatory Visit (HOSPITAL_COMMUNITY): Payer: Medicaid Other | Admitting: Licensed Clinical Social Worker

## 2022-01-02 ENCOUNTER — Telehealth (HOSPITAL_COMMUNITY): Payer: Self-pay | Admitting: Licensed Clinical Social Worker

## 2022-01-02 NOTE — Telephone Encounter (Signed)
LCSW attempted to send two links for virtual appointment with no response. LCSW called pt and number listed in epic stated "Person you are trying to call is not accepting calls at this time".

## 2022-01-23 ENCOUNTER — Encounter (HOSPITAL_COMMUNITY): Payer: Self-pay

## 2022-01-23 ENCOUNTER — Telehealth (HOSPITAL_COMMUNITY): Payer: Self-pay | Admitting: Licensed Clinical Social Worker

## 2022-01-23 ENCOUNTER — Ambulatory Visit (HOSPITAL_COMMUNITY): Payer: Medicaid Other | Admitting: Licensed Clinical Social Worker

## 2022-01-23 NOTE — Telephone Encounter (Signed)
LCSW sent two links to pt phone at 1000 and 1005 with no response. LCSW f/u with a PC at 1009 and LCSW received a message stating "Person dialed is not able to receive calls at this time" LCSW stay in links until 1015 before disconnecting. This is pt 2nd no show in a row for LCSW and 3 total in the last 30 days for [facility]. LCSW advises pt be provided walk in only for therapy moving forward.

## 2022-03-14 ENCOUNTER — Ambulatory Visit (INDEPENDENT_AMBULATORY_CARE_PROVIDER_SITE_OTHER): Payer: Medicaid Other | Admitting: Licensed Clinical Social Worker

## 2022-03-14 DIAGNOSIS — F331 Major depressive disorder, recurrent, moderate: Secondary | ICD-10-CM

## 2022-03-14 NOTE — Progress Notes (Signed)
   THERAPIST PROGRESS NOTE  Virtual Visit via Video Note  I connected with [name] on 03/14/22 at  9:00 AM EDT by a video enabled telemedicine application and verified that I am speaking with the correct person using two identifiers.  Location: Patient: [facility]  Provider: [facility]iders [facility]    I discussed the limitations of evaluation and management by telemedicine and the availability of in person appointments. The patient expressed understanding and agreed to proceed.     I discussed the assessment and treatment plan with the patient. The patient was provided an opportunity to ask questions and all were answered. The patient agreed with the plan and demonstrated an understanding of the instructions.   The patient was advised to call back or seek an in-person evaluation if the symptoms worsen or if the condition fails to improve as anticipated.  I provided 40 minutes of non-face-to-face time during this encounter.   Weber Cooks, LCSW   Participation Level: Active  Behavioral Response: CasualAlertAnxious and Depressed  Type of Therapy: Individual Therapy  Treatment Goals addressed: create 3 coping skills to decrease marijuana use which is daily   ProgressTowards Goals: Not Progressing  Interventions: CBT and Motivational Interviewing   Suicidal/Homicidal: Nowithout intent/plan  Therapist Response:   Pt was alert and oriented x 5. She was dressed casually and engaged well in therapy session. Pt presented with depressed and anxious mood/affect. She was pleasant, cooperative, and maintained good eye contact.   Pt reports primary stressors are grief/loss, financial, and coping skills. Pt reports that her 26 year old nephew passed away. He was sick for a long time but developed a case of pneumonia that took his life.   Intervention: LCSW spoke with pt about the stage of loss anger, depression, bargaining, acceptance, and denial. LCSW review the worksheet with pt  call "Tasks of Mourning." LCSW review worksheet in session and sent it to her email.   Other stressors are for financials. Pt reports she is no longer living in a shelter. She is living with a friend along with her children. Pt reports that her next steps are to find independent housing because she still feels like this housing could be taken at any time.   Interventions: LCSW review Maslow's Hierarchy of needs with pt. LCSW review psychological needs and safety needs which are the first two stages on the pyramid.   Plan: Pt to continue to review Maslow's hierarchy of needs. Pt to maintain stable housing. Pt to review and process through stages of grief.     Plan: Return again in 3 weeks.  Diagnosis: Moderate episode of recurrent major depressive disorder (HCC)  Collaboration of Care: Other None today   Patient/Guardian was advised Release of Information must be obtained prior to any record release in order to collaborate their care with an outside provider. Patient/Guardian was advised if they have not already done so to contact the registration department to sign all necessary forms in order for [place] to release information regarding their care.   Consent: Patient/Guardian gives verbal consent for treatment and assignment of benefits for services provided during this visit. Patient/Guardian expressed understanding and agreed to proceed.   Weber Cooks, LCSW 03/14/2022

## 2022-03-14 NOTE — Plan of Care (Signed)
  Problem: Anxiety Disorder CCP Problem  1 Substance use mood disorder  Goal:  identify 3 trigger for anxiety  Outcome: Progressing Goal: create 3 coping skills to decrease marijuana use which is daily  Outcome: Progressing Goal: STG: Patient will attend at least 80% of scheduled group psychotherapy sessions Outcome: Progressing

## 2022-04-04 ENCOUNTER — Ambulatory Visit (INDEPENDENT_AMBULATORY_CARE_PROVIDER_SITE_OTHER): Payer: Medicaid Other | Admitting: Licensed Clinical Social Worker

## 2022-04-04 DIAGNOSIS — F331 Major depressive disorder, recurrent, moderate: Secondary | ICD-10-CM | POA: Diagnosis not present

## 2022-04-04 NOTE — Progress Notes (Signed)
   THERAPIST PROGRESS NOTE  Virtual Visit via Video Note  I connected with Claudia Moon on 04/04/22 at  9:00 AM EDT by a video enabled telemedicine application and verified that I am speaking with the correct person using two identifiers.  Location: Patient: [facility]  Provider: [facility]    I discussed the limitations of evaluation and management by telemedicine and the availability of in person appointments. The patient expressed understanding and agreed to proceed.     I discussed the assessment and treatment plan with the patient. The patient was provided an opportunity to ask questions and all were answered. The patient agreed with the plan and demonstrated an understanding of the instructions.   The patient was advised to call back or seek an in-person evaluation if the symptoms worsen or if the condition fails to improve as anticipated.  I provided 40 minutes of non-face-to-face time during this encounter.   Dory Horn, LCSW   Participation Level: Active  Behavioral Response: CasualAlertAnxious and Depressed  Type of Therapy: Individual Therapy  Treatment Goals addressed: identify 3 trigger for anxiety  ProgressTowards Goals: Progressing  Interventions: Motivational Interviewing and Supportive  Summary: Claudia Moon is a 26 y.o. female who presents with depressed and anxious mood\affect.  Patient was pleasant, cooperative, maintained good eye contact.  She engaged well in therapy session was dressed casually.  Patient was alert and oriented x5.  Reports primary stressors as medication management, feeling overwhelmed, and managing work and family life.  Patient reports that she has not been taking her medications in some time.  Patient reports an increase in depression and anxiety due to feeling overwhelmed, tension, and worry.  LCSW utilized supportive therapy as primary intervention encouraging patient to take medications as prescribed.  LCSW did  note to patient that she has not been seen since June and it will be up to the provider to refill medications if she does not have any.  LCSW did advise patient to schedule an appointment even if they do decide to fill medications.  Suicidal/Homicidal: Nowithout intent/plan  Therapist Response:     Intervention/Plan: LCSW utilized motivational interviewing for open-ended questions, reflective listening, and positive affirmations.  LCSW utilized reframing in session.  LCSW utilized supportive therapy for praise and encouragement.  Plan for patient is to start back on medication management to help decrease anxiety and depression.  Patient to take medications as prescribed.  Patient advised to schedule medication management appointments to help with depressive and anxiety driven symptoms.  Plan: Return again in 3 weeks.  Diagnosis: Moderate episode of recurrent major depressive disorder ([place])  Collaboration of Care: Other None today   Patient/Guardian was advised Release of Information must be obtained prior to any record release in order to collaborate their care with an outside provider. Patient/Guardian was advised if they have not already done so to contact the registration department to sign all necessary forms in order for [place] to release information regarding their care.   Consent: Patient/Guardian gives verbal consent for treatment and assignment of benefits for services provided during this visit. Patient/Guardian expressed understanding and agreed to proceed.   Dory Horn, LCSW 04/04/2022

## 2022-05-01 ENCOUNTER — Ambulatory Visit (INDEPENDENT_AMBULATORY_CARE_PROVIDER_SITE_OTHER): Payer: Medicaid Other | Admitting: Licensed Clinical Social Worker

## 2022-05-01 DIAGNOSIS — F331 Major depressive disorder, recurrent, moderate: Secondary | ICD-10-CM | POA: Diagnosis not present

## 2022-05-01 DIAGNOSIS — F1210 Cannabis abuse, uncomplicated: Secondary | ICD-10-CM

## 2022-05-01 NOTE — Progress Notes (Signed)
   THERAPIST PROGRESS NOTE  Virtual Visit via Video Note  I connected with Claudia Moon on 05/01/22 at 10:00 AM EDT by a video enabled telemedicine application and verified that I am speaking with the correct person using two identifiers.  Location: Patient: [facility]  Provider: Provider Home    I discussed the limitations of evaluation and management by telemedicine and the availability of in person appointments. The patient expressed understanding and agreed to proceed.      I discussed the assessment and treatment plan with the patient. The patient was provided an opportunity to ask questions and all were answered. The patient agreed with the plan and demonstrated an understanding of the instructions.   The patient was advised to call back or seek an in-person evaluation if the symptoms worsen or if the condition fails to improve as anticipated.  I provided 30 minutes of non-face-to-face time during this encounter.   Claudia Horn, LCSW   Participation Level: Active  Behavioral Response: CasualAlertAnxious and Depressed  Type of Therapy: Individual Therapy  Treatment Goals addressed: Decrease PHQ-9 below 10   ProgressTowards Goals: Progressing  Interventions: Motivational Interviewing, Solution Focused, and Strength-based   Suicidal/Homicidal: Nowithout intent/plan  Therapist Response:   Pt was alert and oriented x 5. She was dressed casually and engaged well in therapy session. Pt was pleasant, cooperative and maintained good eye contact. She presented today with anxious mood/affect.   Primary stressor today is increasing anxiety. Pt endorses symptoms for oversleeping, tension and worry. Pt states that she has been struggling financially as she tries to catch up on her debt. She currently lives with an old college roommate who she reports she does not see eye to eye with.  Pt states things are still civil, but she would like to find independent housing.  [name] reports she does have her name on some public housing lists, but states the lists are long. She reports that she works 50+ hour weeks but with her current debt she cannot afford more than $800 monthly.    Interventions: LCSW used interventions for administering a GAD-7. LCSW administered a PHQ-9. LCSW reviewed scores with pt. LCSW used supportive therapy for praise and encouragement. LCSW used psychoanalytic therapy for pt to express thoughts, feelings, and emotions. LCSW person used motivational interviewing for reflective listening and open-ended questions.    Plan: Return again in 3 weeks.  Diagnosis: Moderate episode of recurrent major depressive disorder (HCC)  Marijuana abuse, continuous  Collaboration of Care: Other None today   Patient/Guardian was advised Release of Information must be obtained prior to any record release in order to collaborate their care with an outside provider. Patient/Guardian was advised if they have not already done so to contact the registration department to sign all necessary forms in order for [place] to release information regarding their care.   Consent: Patient/Guardian gives verbal consent for treatment and assignment of benefits for services provided during this visit. Patient/Guardian expressed understanding and agreed to proceed.   Claudia Horn, LCSW 05/01/2022

## 2022-05-03 ENCOUNTER — Emergency Department (HOSPITAL_COMMUNITY)
Admission: EM | Admit: 2022-05-03 | Discharge: 2022-05-03 | Disposition: A | Discharge status: Home / Self Care | Payer: Medicaid Other | Attending: Emergency Medicine | Admitting: Emergency Medicine

## 2022-05-03 ENCOUNTER — Other Ambulatory Visit: Payer: Self-pay

## 2022-05-03 ENCOUNTER — Emergency Department (HOSPITAL_COMMUNITY): Payer: Medicaid Other

## 2022-05-03 ENCOUNTER — Encounter (HOSPITAL_COMMUNITY): Payer: Self-pay | Admitting: *Deleted

## 2022-05-03 DIAGNOSIS — K529 Noninfective gastroenteritis and colitis, unspecified: Secondary | ICD-10-CM | POA: Insufficient documentation

## 2022-05-03 DIAGNOSIS — K469 Unspecified abdominal hernia without obstruction or gangrene: Secondary | ICD-10-CM | POA: Diagnosis not present

## 2022-05-03 DIAGNOSIS — R1030 Lower abdominal pain, unspecified: Secondary | ICD-10-CM | POA: Diagnosis not present

## 2022-05-03 DIAGNOSIS — R109 Unspecified abdominal pain: Secondary | ICD-10-CM | POA: Diagnosis present

## 2022-05-03 DIAGNOSIS — K449 Diaphragmatic hernia without obstruction or gangrene: Secondary | ICD-10-CM | POA: Diagnosis not present

## 2022-05-03 LAB — COMPREHENSIVE METABOLIC PANEL
ALT: 15 U/L (ref 0–44)
AST: 16 U/L (ref 15–41)
Albumin: 3.6 g/dL (ref 3.5–5.0)
Alkaline Phosphatase: 67 U/L (ref 38–126)
Anion gap: 13 (ref 5–15)
BUN: 5 mg/dL — ABNORMAL LOW (ref 6–20)
CO2: 21 mmol/L — ABNORMAL LOW (ref 22–32)
Calcium: 9.1 mg/dL (ref 8.9–10.3)
Chloride: 100 mmol/L (ref 98–111)
Creatinine, Ser: 0.8 mg/dL (ref 0.44–1.00)
GFR, Estimated: >60 mL/min (ref >60)
Glucose, Bld: 91 mg/dL (ref 70–99)
Potassium: 3.7 mmol/L (ref 3.5–5.1)
Sodium: 134 mmol/L — ABNORMAL LOW (ref 135–145)
Total Bilirubin: 0.8 mg/dL (ref 0.3–1.2)
Total Protein: 7.5 g/dL (ref 6.5–8.1)

## 2022-05-03 LAB — CBC
HCT: 39 % (ref 36.0–46.0)
Hemoglobin: 13.7 g/dL (ref 12.0–15.0)
MCH: 30.9 pg (ref 26.0–34.0)
MCHC: 35.1 g/dL (ref 30.0–36.0)
MCV: 88 fL (ref 80.0–100.0)
Platelets: 243 10*3/uL (ref 150–400)
RBC: 4.43 MIL/uL (ref 3.87–5.11)
RDW: 12.9 % (ref 11.5–15.5)
WBC: 10.9 10*3/uL — ABNORMAL HIGH (ref 4.0–10.5)
nRBC: 0 % (ref 0.0–0.2)

## 2022-05-03 LAB — I-STAT BETA HCG BLOOD, ED (MC, WL, AP ONLY): I-stat hCG, quantitative: <5 m[IU]/mL (ref <5)

## 2022-05-03 LAB — LIPASE, BLOOD: Lipase: 21 U/L (ref 11–51)

## 2022-05-03 MED ORDER — MORPHINE SULFATE (PF) 2 MG/ML IV SOLN
2.0000 mg | Freq: Once | INTRAVENOUS | Status: AC
  Administered 2022-05-03: 2 mg via INTRAVENOUS
  Filled 2022-05-03: qty 1

## 2022-05-03 MED ORDER — ACETAMINOPHEN 500 MG PO TABS
1000.0000 mg | ORAL_TABLET | Freq: Once | ORAL | Status: AC
  Administered 2022-05-03: 1000 mg via ORAL
  Filled 2022-05-03: qty 2

## 2022-05-03 MED ORDER — IBUPROFEN 400 MG PO TABS
600.0000 mg | ORAL_TABLET | Freq: Once | ORAL | Status: AC
  Administered 2022-05-03: 600 mg via ORAL
  Filled 2022-05-03: qty 1

## 2022-05-03 MED ORDER — IOHEXOL 350 MG/ML SOLN
80.0000 mL | Freq: Once | INTRAVENOUS | Status: AC | PRN
  Administered 2022-05-03: 80 mL via INTRAVENOUS

## 2022-05-03 NOTE — Discharge Instructions (Addendum)
Please take tylenol/ibuprofen for pain. You can take 650 mg of tylenol every 4 hours or 400 mg of ibuprofen every 4-6 hours. I recommend close follow-up with your PCP for reevaluation.  Please do not hesitate to return to emergency department if worrisome signs symptoms we discussed become apparent.

## 2022-05-03 NOTE — ED Provider Notes (Signed)
[facility] EMERGENCY DEPARTMENT Provider Note   CSN: [id number] Arrival date & time: 05/03/22  4098     History  Chief Complaint  Patient presents with   Abdominal Pain    Claudia Moon is a 26 y.o. female with a past medical history of anxiety and depression is presenting to the emergency department for evaluation of abdominal pain.  States she started to have abdominal pain around 10:00 last night.  States the pain is mostly on her lower abdomen.  The pain is sharp, constant, nonradiating.  Endorses nausea without vomiting.  Reports last bowel movement 2 days ago.  She been having constipation and diarrhea.  No blood in her stool or urine.  Denies chest pain, shortness of breath, fever, rash.   Abdominal Pain      Home Medications Prior to Admission medications   Medication Sig Start Date End Date Taking? Authorizing Provider  albuterol (VENTOLIN HFA) 108 (90 Base) MCG/ACT inhaler Inhale 2 puffs into the lungs every 6 (six) hours as needed for wheezing or shortness of breath. 09/02/21   [name], FNP  FLUoxetine (PROZAC) 10 MG capsule Take 1 capsule (10 mg total) by mouth daily. 11/20/21 11/20/22  Nwoko, Terese Door, PA  traZODone (DESYREL) 50 MG tablet Take 1 tablet (50 mg total) by mouth at bedtime as needed for sleep. 11/20/21   Malachy Mood, PA      Allergies    Patient has no known allergies.    Review of Systems   Review of Systems  Gastrointestinal:  Positive for abdominal pain.    Physical Exam Updated Vital Signs BP 107/69   Pulse (!) 102   Temp 99.2 F (37.3 C) (Oral)   Resp 16   SpO2 95%  Physical Exam Vitals and nursing note reviewed.  Constitutional:      Appearance: Normal appearance.  HENT:     Head: Normocephalic and atraumatic.     Mouth/Throat:     Mouth: Mucous membranes are moist.  Eyes:     General: No scleral icterus. Cardiovascular:     Rate and Rhythm: Normal rate and regular rhythm.     Pulses: Normal pulses.      Heart sounds: Normal heart sounds.  Pulmonary:     Effort: Pulmonary effort is normal.     Breath sounds: Normal breath sounds.  Abdominal:     General: Abdomen is flat.     Palpations: Abdomen is soft.     Tenderness: There is abdominal tenderness in the right lower quadrant and left lower quadrant.     Comments: Abdomen is soft.  No guarding.  No rigidity.  Musculoskeletal:        General: No deformity.  Skin:    General: Skin is warm.     Findings: No rash.  Neurological:     General: No focal deficit present.     Mental Status: She is alert.  Psychiatric:        Mood and Affect: Mood normal.     ED Results / Procedures / Treatments   Labs (all labs ordered are listed, but only abnormal results are displayed) Labs Reviewed  COMPREHENSIVE METABOLIC PANEL - Abnormal; Notable for the following components:      Result Value   Sodium 134 (*)    CO2 21 (*)    BUN 5 (*)    All other components within normal limits  CBC - Abnormal; Notable for the following components:   WBC 10.9 (*)  All other components within normal limits  LIPASE, BLOOD  URINALYSIS, ROUTINE W REFLEX MICROSCOPIC  I-STAT BETA HCG BLOOD, ED ([facility], [facility], [facility] ONLY)    EKG None  Radiology No results found.  Procedures Procedures    Medications Ordered in ED Medications - No data to display  ED Course/ Medical Decision Making/ A&P                           Medical Decision Making Amount and/or Complexity of Data Reviewed Labs: ordered. Radiology: ordered.   This patient presents to the ED for abdominal pain, this involves an extensive number of treatment options, and is a complaint that carries with a high risk of complications and morbidity.  The differential diagnosis includes appendicitis, diverticulitis, gallbladder stone, kidney stone, pyelonephritis, cystitis, small bowel obstructions, UTI.  This is not an exhaustive list.  Comorbidities that complicate the patient evaluation See  HPI  Social determinants of health NA  Additional history obtained: Additional history obtained from EMR. External records from outside source obtained and review including prior labs  Cardiac monitoring/EKG: The patient was maintained on a cardiac monitor.  I personally reviewed and interpreted the cardiac monitor which showed an underlying rhythm of: Sinus rhythm.  Lab tests: I ordered and personally interpreted labs.  The pertinent results include WBC leukocytosis 10.9. Hbg normal. Platelets normal. No electrolyte abnormalities noted. BUN, creatinine normal. No transaminitis. UA significant for no acute abnormality.   Imaging studies: I ordered imaging studies including CT abdomen pelvis with contrast. I personally reviewed, interpreted imaging and agree with the radiologist's interpretations.  Problem list/ ED course/ Critical interventions/ Medical management: HPI: See above Vital signs temperature 101.9, pulse rate 108 otherwise within normal range and stable throughout visit. Laboratory/imaging studies significant for: See above. On physical examination, patient is afebrile and appears in no acute distress. There is abdominal tenderness on the LLQ and RLQ. Her abdomen is soft. No guarding or rigidity. Small bowel obstruction is unlikely. CT scan results suggested gastroenteritis without evidence of appendicitis or diverticulitis. Ectopic pregnancy unlikely as pregnancy test negative. Ovarian torsion unlikely.  Patient's clinical presentations and laboratory/imaging studies are most concerned for gastroenteritis likely viral.  CT scan of the abdomen also showed hiatal hernia. That can be managed by PCP by patient is asymptomatic.  We will continue with conservative treatment.  I sent a Rx of naproxen for pain control. Advised patient to follow soft diet plan.. I have reviewed the patient home medicines and have made adjustments as needed.  Consultations obtained: I requested  consultation with the attending Dr. Theresia Lo, and discussed lab and imaging findings as well as pertinent plan.  They recommend outpatient follow-up with PCP for further evaluation and management  Disposition Continued outpatient therapy. Follow-up with PCP  recommended for reevaluation of symptoms. Treatment plan discussed with patient.  Pt acknowledged understanding was agreeable to the plan. Worrisome signs and symptoms were discussed with patient, and patient acknowledged understanding to return to the ED if they noticed these signs and symptoms. Patient was stable upon discharge.   This chart was dictated using voice recognition software.  Despite best efforts to proofread,  errors can occur which can change the documentation meaning.          Final Clinical Impression(s) / ED Diagnoses Final diagnoses:  Gastroenteritis  Hiatal hernia    Rx / DC Orders ED Discharge Orders     None  Jeanelle Malling, PA 05/03/22 1336    Elayne Snare K, DO 05/03/22 1407

## 2022-05-03 NOTE — ED Triage Notes (Signed)
Pt reporting pain that goes across the middle of her abdomen since around 2000 last night. LBM 3 days ago.Took mirilax without relief.  Rectal pain when she coughs. Nausea.  Denies fevers.

## 2022-05-03 NOTE — ED Notes (Signed)
Notified Le Pa of fever 101.2, motrin ordered along with morphine as patient is still experiencing 10/10 abd pain

## 2022-05-03 NOTE — ED Notes (Signed)
Patient transported to CT 

## 2022-05-11 ENCOUNTER — Other Ambulatory Visit (HOSPITAL_COMMUNITY): Payer: Self-pay | Admitting: Physician Assistant

## 2022-05-11 DIAGNOSIS — F1994 Other psychoactive substance use, unspecified with psychoactive substance-induced mood disorder: Secondary | ICD-10-CM

## 2022-05-11 DIAGNOSIS — F331 Major depressive disorder, recurrent, moderate: Secondary | ICD-10-CM

## 2022-05-11 DIAGNOSIS — F411 Generalized anxiety disorder: Secondary | ICD-10-CM

## 2022-05-11 DIAGNOSIS — G479 Sleep disorder, unspecified: Secondary | ICD-10-CM

## 2022-05-13 ENCOUNTER — Ambulatory Visit (INDEPENDENT_AMBULATORY_CARE_PROVIDER_SITE_OTHER): Payer: Medicaid Other | Admitting: Licensed Clinical Social Worker

## 2022-05-13 DIAGNOSIS — F1994 Other psychoactive substance use, unspecified with psychoactive substance-induced mood disorder: Secondary | ICD-10-CM

## 2022-05-13 DIAGNOSIS — F331 Major depressive disorder, recurrent, moderate: Secondary | ICD-10-CM | POA: Diagnosis not present

## 2022-05-13 NOTE — Progress Notes (Signed)
   THERAPIST PROGRESS NOTE  Virtual Visit via Video Note  I connected with [name] on 05/13/22 at 10:00 AM EST by a video enabled telemedicine application and verified that I am speaking with the correct person using two identifiers.  Location: Patient: [facility]  Provider: [facility]    I discussed the limitations of evaluation and management by telemedicine and the availability of in person appointments. The patient expressed understanding and agreed to proceed.      I discussed the assessment and treatment plan with the patient. The patient was provided an opportunity to ask questions and all were answered. The patient agreed with the plan and demonstrated an understanding of the instructions.   The patient was advised to call back or seek an in-person evaluation if the symptoms worsen or if the condition fails to improve as anticipated.  I provided 30 minutes of non-face-to-face time during this encounter.   Weber Cooks, LCSW   Participation Level: Active  Behavioral Response: CasualAlertAnxious and Depressed  Type of Therapy: Individual Therapy  Treatment Goals addressed: Problem: Anxiety Disorder CCP Problem  1 Substance use mood disorder  Goal:  identify 3 trigger for anxiety  Outcome: Progressing  ProgressTowards Goals: Progressing  Interventions: Motivational Interviewing and Supportive    Suicidal/Homicidal: Nowithout intent/plan  Therapist Response:      Pt was alert and oriented x 5. She was dressed causally and engaged well in therapy session. Pt presented with depressed and anxious mood/affect. She was pleasant, cooperative and maintained good eye contact.   Pt reports primary stressor as relationship, housing, and marijuana use. Pt reports an overall decrease in marijuana use since starting therapy going from daily 3 to 4 times daily to now 1 blunt per day. Pt reports an over decrease in agitation due to decrease. She reports  problems within her relationship. Pt states she wanted to become exclusive with the guys she has been talking to for 6+ months, however pt reports some indecisiveness on his part. Stating he has made some immature posts and gone back and forth with living with one another. Pt also reports stressor for housing as she currently lives with an old college friend and has until early 2024 to find a new place for herself and two children to live in.   Interventions/Plan: LCSW used psychoanalytic therapy for pt to express thoughts, feeling and emotions. LCSW administered a GAD-7. LCSW administered a PHQ-9. LCSW reviewed scores with pt. LCSW notes an increase in PHQ-9 and decrease in GAD-7.   Plan: Return again in 3 weeks.  Diagnosis: Moderate episode of recurrent major depressive disorder (HCC)  Substance induced mood disorder (HCC)  Collaboration of Care: Other None today   Patient/Guardian was advised Release of Information must be obtained prior to any record release in order to collaborate their care with an outside provider. Patient/Guardian was advised if they have not already done so to contact the registration department to sign all necessary forms in order for [place] to release information regarding their care.   Consent: Patient/Guardian gives verbal consent for treatment and assignment of benefits for services provided during this visit. Patient/Guardian expressed understanding and agreed to proceed.   Weber Cooks, LCSW 05/13/2022

## 2022-05-13 NOTE — Plan of Care (Signed)
  Problem: Anxiety Disorder CCP Problem  1 Substance use mood disorder  Goal:  identify 3 trigger for anxiety  Outcome: Progressing Goal: create 3 coping skills to decrease marijuana use which is daily  Outcome: Progressing Goal: LTG: Patient will score less than 5 on the Generalized Anxiety Disorder 7 Scale (GAD-7) Outcome: Progressing   Problem: Depression CCP Problem  1 MDD  Goal: STG: Rosselyn WILL PARTICIPATE IN AT LEAST 80% OF SCHEDULED INDIVIDUAL PSYCHOTHERAPY SESSIONS Outcome: Progressing Goal: STG: Shaneece WILL COMPLETE AT LEAST 80% OF ASSIGNED HOMEWORK Outcome: Progressing   Problem: Anxiety Disorder CCP Problem  1 Substance use mood disorder  Goal: walk 3 x weekly  Outcome: Not Progressing Goal: STG: Patient will attend at least 80% of scheduled group psychotherapy sessions Outcome: Not Progressing   Problem: Depression CCP Problem  1 MDD  Goal: LTG: Mazel WILL SCORE LESS THAN 10 ON THE PATIENT HEALTH QUESTIONNAIRE (PHQ-9) Outcome: Not Progressing

## 2022-05-20 ENCOUNTER — Encounter: Payer: Self-pay | Admitting: Gastroenterology

## 2022-05-20 ENCOUNTER — Encounter (HOSPITAL_BASED_OUTPATIENT_CLINIC_OR_DEPARTMENT_OTHER): Payer: Self-pay | Admitting: Family Medicine

## 2022-05-20 ENCOUNTER — Ambulatory Visit (HOSPITAL_BASED_OUTPATIENT_CLINIC_OR_DEPARTMENT_OTHER): Payer: Medicaid Other | Admitting: Family Medicine

## 2022-05-20 VITALS — BP 139/79 | HR 62 | Ht 68.0 in | Wt 264.0 lb

## 2022-05-20 DIAGNOSIS — N926 Irregular menstruation, unspecified: Secondary | ICD-10-CM | POA: Diagnosis not present

## 2022-05-20 DIAGNOSIS — K625 Hemorrhage of anus and rectum: Secondary | ICD-10-CM

## 2022-05-20 DIAGNOSIS — R300 Dysuria: Secondary | ICD-10-CM

## 2022-05-20 DIAGNOSIS — Z23 Encounter for immunization: Secondary | ICD-10-CM | POA: Diagnosis not present

## 2022-05-20 LAB — POCT URINALYSIS DIPSTICK
Bilirubin, UA: NEGATIVE
Glucose, UA: NEGATIVE
Ketones, UA: NEGATIVE
Nitrite, UA: NEGATIVE
Protein, UA: NEGATIVE
Spec Grav, UA: 1.02 (ref 1.010–1.025)
Urobilinogen, UA: 0.2 E.U./dL
pH, UA: 7.5 (ref 5.0–8.0)

## 2022-05-20 MED ORDER — NITROFURANTOIN MONOHYD MACRO 100 MG PO CAPS: 100.0000 mg | ORAL_CAPSULE | Freq: Two times a day (BID) | ORAL | 0 refills | Status: AC

## 2022-05-20 NOTE — Assessment & Plan Note (Signed)
Patient reports about 2 days ago, she began to have some discomfort with urination.  She has not noticed any blood in the urine, no change in urine odor.  She has not had any stomach symptoms such as fever, chills, sweats.  No abdominal pain or back pain.  She has had prior UTI in the past, however reports that current symptoms are not quite as severe as what she has experienced in the past. On exam, patient is in no acute distress, vital signs stable, patient is afebrile.  Cardiovascular exam with regular rate and rhythm, no murmur appreciated.  Lungs clear to auscultation bilaterally.  No CVA tenderness bilaterally.  Abdomen with normal bowel sounds, soft, nontender, nondistended. Point-of-care urinalysis reveals trace leukocyte esterase, negative nitrite blood.  Finding of blood is not unexpected given that patient reports ongoing menstrual bleeding.  Given equivocal findings of potential UTI, we will initiate treatment with antibiotic therapy and send urine for culture for definitive result and to assess for any antibiotic resistance. Recommend ensuring adequate hydration, discussed potential side effects related to antibiotics

## 2022-05-20 NOTE — Progress Notes (Unsigned)
    Procedures performed today:    None.  Independent interpretation of notes and tests performed by another provider:   None.  Brief History, Exam, Impression, and Recommendations:    BP 139/79 (BP Location: Right Arm, Patient Position: Sitting, Cuff Size: Large)   Pulse 62   Ht 5\' 8"  (1.727 m)   Wt 264 lb (119.7 kg)   SpO2 99%   BMI 40.14 kg/m   Dysuria Patient reports about 2 days ago, she began to have some discomfort with urination.  She has not noticed any blood in the urine, no change in urine odor.  She has not had any stomach symptoms such as fever, chills, sweats.  No abdominal pain or back pain.  She has had prior UTI in the past, however reports that current symptoms are not quite as severe as what she has experienced in the past. On exam, patient is in no acute distress, vital signs stable, patient is afebrile.  Cardiovascular exam with regular rate and rhythm, no murmur appreciated.  Lungs clear to auscultation bilaterally.  No CVA tenderness bilaterally.  Abdomen with normal bowel sounds, soft, nontender, nondistended. Point-of-care urinalysis reveals trace leukocyte esterase, negative nitrite blood.  Finding of blood is not unexpected given that patient reports ongoing menstrual bleeding.  Given equivocal findings of potential UTI, we will initiate treatment with antibiotic therapy and send urine for culture for definitive result and to assess for any antibiotic resistance. Recommend ensuring adequate hydration, discussed potential side effects related to antibiotics  Irregular menstrual bleeding Patient has been having intermittent issues with heavy menstrual bleeding.  More recently has been an issue over the past few weeks.  She did have labs earlier this month which were reassuring in regards to no anemia observed.  She has not been having any significant lightheadedness or dizziness.  In office today, vital signs are stable with normal blood pressure, normal pulse  rate. No overt signs of anemia/hypovolemia at this time.  Discussed these potential warning signs with patient and recommend that she return to the office should she have any concerns or can also present to emergency department if symptoms do acutely worsen. We will proceed with referral to establish with OB/GYN locally for further evaluation regarding issues.  Return in about 2 months (around 07/20/2022).   ___________________________________________ Jourdyn Hasler de 07/22/2022, MD, ABFM, CAQSM Primary Care and Sports Medicine [facility]

## 2022-05-20 NOTE — Patient Instructions (Signed)
  Medication Instructions:  Your physician recommends that you continue on your current medications as directed. Please refer to the Current Medication list given to you today. --If you need a refill on any your medications before your next appointment, please call your pharmacy first. If no refills are authorized on file call the office.-- Lab Work: Your physician has recommended that you have lab work today: Yes, Urine Culture If you have labs (blood work) drawn today and your tests are completely normal, you will receive your results via MyChart message OR a phone call from our staff.  Please ensure you check your voicemail in the event that you authorized detailed messages to be left on a delegated number. If you have any lab test that is abnormal or we need to change your treatment, we will call you to review the results.  Referrals/Procedures/Imaging: No  Follow-Up: Your next appointment:   Your physician recommends that you schedule a follow-up appointment in: 2-3 months with Dr. de Peru.  You will receive a text message or e-mail with a link to a survey about your care and experience with [place] today! We would greatly appreciate your feedback!   Thanks for letting us be apart of your health journey!!  Primary Care and Sports Medicine   Dr. Ceasar Mons Peru   We encourage you to activate your patient portal called "MyChart".  Sign up information is provided on this After Visit Summary.  MyChart is used to connect with patients for Virtual Visits (Telemedicine).  Patients are able to view lab/test results, encounter notes, upcoming appointments, etc.  Non-urgent messages can be sent to your provider as well. To learn more about what you can do with MyChart, please visit --  [email or web address].

## 2022-05-20 NOTE — Assessment & Plan Note (Signed)
Patient has been having intermittent issues with heavy menstrual bleeding.  More recently has been an issue over the past few weeks.  She did have labs earlier this month which were reassuring in regards to no anemia observed.  She has not been having any significant lightheadedness or dizziness.  In office today, vital signs are stable with normal blood pressure, normal pulse rate. No overt signs of anemia/hypovolemia at this time.  Discussed these potential warning signs with patient and recommend that she return to the office should she have any concerns or can also present to emergency department if symptoms do acutely worsen. We will proceed with referral to establish with OB/GYN locally for further evaluation regarding issues.

## 2022-05-21 ENCOUNTER — Telehealth (HOSPITAL_BASED_OUTPATIENT_CLINIC_OR_DEPARTMENT_OTHER): Payer: Self-pay

## 2022-05-21 LAB — URINE CULTURE

## 2022-05-21 MED ORDER — FLUCONAZOLE 150 MG PO TABS: 150.0000 mg | ORAL_TABLET | Freq: Once | ORAL | 0 refills | Status: AC

## 2022-05-21 NOTE — Addendum Note (Signed)
Addended by: DE Peru, Abrar Bilton J on: 05/21/2022 04:19 PM   Modules accepted: Orders

## 2022-05-21 NOTE — Telephone Encounter (Signed)
Pt called and stated she does not believe she has a urinary tract infection. She stated she think it is a yeast infection and she asked if something could be sent in for this if possible. She does not want to wait until next week for medication because she said she has some itching in her private area.  Thanks

## 2022-06-06 ENCOUNTER — Encounter (HOSPITAL_COMMUNITY): Payer: Self-pay

## 2022-06-06 ENCOUNTER — Ambulatory Visit (INDEPENDENT_AMBULATORY_CARE_PROVIDER_SITE_OTHER): Payer: Medicaid Other | Admitting: Licensed Clinical Social Worker

## 2022-06-06 DIAGNOSIS — F331 Major depressive disorder, recurrent, moderate: Secondary | ICD-10-CM

## 2022-06-06 NOTE — Progress Notes (Signed)
   THERAPIST PROGRESS NOTE Virtual Visit via Video Note  I connected with [name] on 06/06/22 at 11:00 AM EST by a video enabled telemedicine application and verified that I am speaking with the correct person using two identifiers.  Location: Patient: [facility]  Provider: Provider Home    I discussed the limitations of evaluation and management by telemedicine and the availability of in person appointments. The patient expressed understanding and agreed to proceed.     I discussed the assessment and treatment plan with the patient. The patient was provided an opportunity to ask questions and all were answered. The patient agreed with the plan and demonstrated an understanding of the instructions.   The patient was advised to call back or seek an in-person evaluation if the symptoms worsen or if the condition fails to improve as anticipated.  I provided 30 minutes of non-face-to-face time during this encounter.   Weber Cooks, LCSW   Participation Level: Active  Behavioral Response: CasualAlertAnxious and Depressed  Type of Therapy: Individual Therapy  Treatment Goals addressed:  roblem: Anxiety Disorder CCP Problem  1 Substance use mood disorder  Goal:  identify 3 trigger for anxiety  Outcome: Progressing   ProgressTowards Goals: Progressing  Interventions: CBT and Motivational Interviewing  Suicidal/Homicidal: Nowithout intent/plan  Therapist Response:   Pt was alert and oriented x 5. She was dressed casually and engaged well in therapy session. She presented with depressed and anxious mood/affect. She was pleasant, cooperative, and maintained good eye contact.   Primary stressor is housing and marijuana use. Pt reports overall marijuana use had decrease from daily to less than 4 days out of the week. Pt reports that she is also trying to gain independent housing. She is currently living with a college friend. She reported for the first time that she had  sexual relations with this friend and pt has since stopped the intimacy part of it as her friend got feeling for pt. Jennifr reports that she thought it was just two friends "fooling around" but her friend/roommate got feeling for pt. Pt states she has meeting with landlord lined up as early as today to get out of the housing situation by the end of Jan.   Intervention/Plan: LCSW used supportive therapy for praise and encouragement. LCSW used psychoanalytic therapy for pt to express thoughts, feelings and emotions. LCSW used reflective listening and open-ended questions for motivational interviewing      Plan: Return again in 3 weeks.  Diagnosis: Moderate episode of recurrent major depressive disorder (HCC)  Collaboration of Care: Other None today   Patient/Guardian was advised Release of Information must be obtained prior to any record release in order to collaborate their care with an outside provider. Patient/Guardian was advised if they have not already done so to contact the registration department to sign all necessary forms in order for [place] to release information regarding their care.   Consent: Patient/Guardian gives verbal consent for treatment and assignment of benefits for services provided during this visit. Patient/Guardian expressed understanding and agreed to proceed.   Weber Cooks, LCSW 06/06/2022

## 2022-06-12 DIAGNOSIS — R1011 Right upper quadrant pain: Secondary | ICD-10-CM | POA: Diagnosis not present

## 2022-06-12 DIAGNOSIS — R112 Nausea with vomiting, unspecified: Secondary | ICD-10-CM | POA: Diagnosis not present

## 2022-06-13 ENCOUNTER — Emergency Department (HOSPITAL_COMMUNITY)
Admission: EM | Admit: 2022-06-13 | Discharge: 2022-06-13 | Disposition: A | Discharge status: Home / Self Care | Payer: Medicaid Other | Source: Home / Self Care | Attending: Emergency Medicine | Admitting: Emergency Medicine

## 2022-06-13 ENCOUNTER — Other Ambulatory Visit: Payer: Self-pay

## 2022-06-13 ENCOUNTER — Encounter (HOSPITAL_COMMUNITY): Payer: Self-pay

## 2022-06-13 DIAGNOSIS — N12 Tubulo-interstitial nephritis, not specified as acute or chronic: Secondary | ICD-10-CM | POA: Insufficient documentation

## 2022-06-13 DIAGNOSIS — N9489 Other specified conditions associated with female genital organs and menstrual cycle: Secondary | ICD-10-CM | POA: Insufficient documentation

## 2022-06-13 LAB — COMPREHENSIVE METABOLIC PANEL
ALT: 22 U/L (ref 0–44)
AST: 26 U/L (ref 15–41)
Albumin: 3.8 g/dL (ref 3.5–5.0)
Alkaline Phosphatase: 60 U/L (ref 38–126)
Anion gap: 11 (ref 5–15)
BUN: 9 mg/dL (ref 6–20)
CO2: 23 mmol/L (ref 22–32)
Calcium: 8.7 mg/dL — ABNORMAL LOW (ref 8.9–10.3)
Chloride: 98 mmol/L (ref 98–111)
Creatinine, Ser: 0.93 mg/dL (ref 0.44–1.00)
GFR, Estimated: >60 mL/min (ref >60)
Glucose, Bld: 95 mg/dL (ref 70–99)
Potassium: 3.5 mmol/L (ref 3.5–5.1)
Sodium: 132 mmol/L — ABNORMAL LOW (ref 135–145)
Total Bilirubin: 0.7 mg/dL (ref 0.3–1.2)
Total Protein: 7.8 g/dL (ref 6.5–8.1)

## 2022-06-13 LAB — URINALYSIS, ROUTINE W REFLEX MICROSCOPIC
Bilirubin Urine: NEGATIVE
Glucose, UA: NEGATIVE mg/dL
Ketones, ur: 5 mg/dL — AB
Nitrite: NEGATIVE
Protein, ur: 30 mg/dL — AB
Specific Gravity, Urine: 1.014 (ref 1.005–1.030)
WBC, UA: >50 WBC/hpf — ABNORMAL HIGH (ref 0–5)
pH: 6 (ref 5.0–8.0)

## 2022-06-13 LAB — CBC
HCT: 44.9 % (ref 36.0–46.0)
Hemoglobin: 15.1 g/dL — ABNORMAL HIGH (ref 12.0–15.0)
MCH: 30.1 pg (ref 26.0–34.0)
MCHC: 33.6 g/dL (ref 30.0–36.0)
MCV: 89.6 fL (ref 80.0–100.0)
Platelets: 258 10*3/uL (ref 150–400)
RBC: 5.01 MIL/uL (ref 3.87–5.11)
RDW: 13.1 % (ref 11.5–15.5)
WBC: 10.7 10*3/uL — ABNORMAL HIGH (ref 4.0–10.5)
nRBC: 0 % (ref 0.0–0.2)

## 2022-06-13 LAB — I-STAT BETA HCG BLOOD, ED (MC, WL, AP ONLY): I-stat hCG, quantitative: <5 m[IU]/mL (ref <5)

## 2022-06-13 LAB — LIPASE, BLOOD: Lipase: 23 U/L (ref 11–51)

## 2022-06-13 MED ORDER — SODIUM CHLORIDE 0.9 % IV SOLN
2.0000 g | Freq: Once | INTRAVENOUS | Status: AC
  Administered 2022-06-13: 2 g via INTRAVENOUS
  Filled 2022-06-13: qty 20

## 2022-06-13 MED ORDER — IBUPROFEN 400 MG PO TABS
400.0000 mg | ORAL_TABLET | Freq: Once | ORAL | Status: AC
  Administered 2022-06-13: 400 mg via ORAL
  Filled 2022-06-13: qty 1

## 2022-06-13 MED ORDER — ACETAMINOPHEN 325 MG PO TABS
650.0000 mg | ORAL_TABLET | Freq: Once | ORAL | Status: AC | PRN
  Administered 2022-06-13: 650 mg via ORAL
  Filled 2022-06-13: qty 2

## 2022-06-13 MED ORDER — LACTATED RINGERS IV BOLUS
1000.0000 mL | Freq: Once | INTRAVENOUS | Status: AC
  Administered 2022-06-13: 1000 mL via INTRAVENOUS

## 2022-06-13 MED ORDER — LEVOFLOXACIN 750 MG PO TABS: 750.0000 mg | ORAL_TABLET | Freq: Every day | ORAL | 0 refills | Status: DC

## 2022-06-13 NOTE — ED Notes (Addendum)
Pt needed to leave and pick up her kids, EDP notified. EDP d/c'd pt. Treated and d/c'd by other RNs

## 2022-06-13 NOTE — ED Notes (Signed)
Alert, NAD, calm, interactive, speaking with EDP at [facility].

## 2022-06-13 NOTE — Discharge Instructions (Addendum)
It was our pleasure to provide your ER care today - we hope that you feel better.  Drink plenty of fluids/stay well hydrated. Take levaquin (antibiotic) as prescribed.   Take acetaminophen or ibuprofen as need.   Return if reconsider and want to have CT scan or other testing done.   Return to ER right away if worse, new symptoms, new or worsening or severe abdominal pain, persistent vomiting, weak/fainting, trouble breathing, or other concern.

## 2022-06-13 NOTE — ED Triage Notes (Signed)
Pt arrived POV from home c/o RLQ abdominal pain that started 2 days ago. Pt endorses N/V and frequency with urination.

## 2022-06-14 ENCOUNTER — Other Ambulatory Visit: Payer: Self-pay

## 2022-06-14 ENCOUNTER — Emergency Department (HOSPITAL_COMMUNITY): Payer: Medicaid Other

## 2022-06-14 ENCOUNTER — Encounter (HOSPITAL_COMMUNITY): Payer: Self-pay

## 2022-06-14 ENCOUNTER — Inpatient Hospital Stay (HOSPITAL_COMMUNITY)
Admission: EM | Admit: 2022-06-14 | Discharge: 2022-06-17 | DRG: 872 | Disposition: A | Discharge status: Home / Self Care | Payer: Medicaid Other | Attending: Internal Medicine | Admitting: Internal Medicine

## 2022-06-14 DIAGNOSIS — N3090 Cystitis, unspecified without hematuria: Secondary | ICD-10-CM | POA: Diagnosis present

## 2022-06-14 DIAGNOSIS — Z20822 Contact with and (suspected) exposure to covid-19: Secondary | ICD-10-CM | POA: Diagnosis not present

## 2022-06-14 DIAGNOSIS — N10 Acute tubulo-interstitial nephritis: Secondary | ICD-10-CM | POA: Diagnosis not present

## 2022-06-14 DIAGNOSIS — N289 Disorder of kidney and ureter, unspecified: Secondary | ICD-10-CM

## 2022-06-14 DIAGNOSIS — R112 Nausea with vomiting, unspecified: Secondary | ICD-10-CM | POA: Diagnosis not present

## 2022-06-14 DIAGNOSIS — N12 Tubulo-interstitial nephritis, not specified as acute or chronic: Secondary | ICD-10-CM | POA: Diagnosis not present

## 2022-06-14 DIAGNOSIS — Z833 Family history of diabetes mellitus: Secondary | ICD-10-CM

## 2022-06-14 DIAGNOSIS — Z8249 Family history of ischemic heart disease and other diseases of the circulatory system: Secondary | ICD-10-CM | POA: Diagnosis not present

## 2022-06-14 DIAGNOSIS — F32A Depression, unspecified: Secondary | ICD-10-CM | POA: Diagnosis not present

## 2022-06-14 DIAGNOSIS — R1084 Generalized abdominal pain: Secondary | ICD-10-CM | POA: Diagnosis not present

## 2022-06-14 DIAGNOSIS — R1031 Right lower quadrant pain: Secondary | ICD-10-CM | POA: Diagnosis not present

## 2022-06-14 DIAGNOSIS — Z809 Family history of malignant neoplasm, unspecified: Secondary | ICD-10-CM | POA: Diagnosis not present

## 2022-06-14 DIAGNOSIS — R000 Tachycardia, unspecified: Secondary | ICD-10-CM | POA: Diagnosis not present

## 2022-06-14 DIAGNOSIS — E669 Obesity, unspecified: Secondary | ICD-10-CM | POA: Diagnosis not present

## 2022-06-14 DIAGNOSIS — E871 Hypo-osmolality and hyponatremia: Secondary | ICD-10-CM | POA: Diagnosis not present

## 2022-06-14 DIAGNOSIS — Z818 Family history of other mental and behavioral disorders: Secondary | ICD-10-CM

## 2022-06-14 DIAGNOSIS — R42 Dizziness and giddiness: Secondary | ICD-10-CM | POA: Diagnosis not present

## 2022-06-14 DIAGNOSIS — R509 Fever, unspecified: Secondary | ICD-10-CM | POA: Diagnosis not present

## 2022-06-14 DIAGNOSIS — Z823 Family history of stroke: Secondary | ICD-10-CM | POA: Diagnosis not present

## 2022-06-14 DIAGNOSIS — A419 Sepsis, unspecified organism: Secondary | ICD-10-CM | POA: Diagnosis not present

## 2022-06-14 DIAGNOSIS — F411 Generalized anxiety disorder: Secondary | ICD-10-CM | POA: Diagnosis present

## 2022-06-14 DIAGNOSIS — N3289 Other specified disorders of bladder: Secondary | ICD-10-CM | POA: Diagnosis not present

## 2022-06-14 DIAGNOSIS — Z6839 Body mass index (BMI) 39.0-39.9, adult: Secondary | ICD-10-CM

## 2022-06-14 DIAGNOSIS — R202 Paresthesia of skin: Secondary | ICD-10-CM | POA: Diagnosis not present

## 2022-06-14 HISTORY — DX: Disorder of kidney and ureter, unspecified: N28.9

## 2022-06-14 LAB — COMPREHENSIVE METABOLIC PANEL
ALT: 29 U/L (ref 0–44)
AST: 27 U/L (ref 15–41)
Albumin: 3.7 g/dL (ref 3.5–5.0)
Alkaline Phosphatase: 73 U/L (ref 38–126)
Anion gap: 12 (ref 5–15)
BUN: 11 mg/dL (ref 6–20)
CO2: 20 mmol/L — ABNORMAL LOW (ref 22–32)
Calcium: 8.6 mg/dL — ABNORMAL LOW (ref 8.9–10.3)
Chloride: 100 mmol/L (ref 98–111)
Creatinine, Ser: 0.86 mg/dL (ref 0.44–1.00)
GFR, Estimated: >60 mL/min (ref >60)
Glucose, Bld: 96 mg/dL (ref 70–99)
Potassium: 2.8 mmol/L — ABNORMAL LOW (ref 3.5–5.1)
Sodium: 132 mmol/L — ABNORMAL LOW (ref 135–145)
Total Bilirubin: 1.1 mg/dL (ref 0.3–1.2)
Total Protein: 8 g/dL (ref 6.5–8.1)

## 2022-06-14 LAB — I-STAT BETA HCG BLOOD, ED (MC, WL, AP ONLY): I-stat hCG, quantitative: <5 m[IU]/mL (ref <5)

## 2022-06-14 LAB — CBC WITH DIFFERENTIAL/PLATELET
Abs Immature Granulocytes: 0.1 10*3/uL — ABNORMAL HIGH (ref 0.00–0.07)
Basophils Absolute: 0 10*3/uL (ref 0.0–0.1)
Basophils Relative: 0 %
Eosinophils Absolute: 0.1 10*3/uL (ref 0.0–0.5)
Eosinophils Relative: 0 %
HCT: 38.6 % (ref 36.0–46.0)
Hemoglobin: 13.5 g/dL (ref 12.0–15.0)
Immature Granulocytes: 1 %
Lymphocytes Relative: 11 %
Lymphs Abs: 1.4 10*3/uL (ref 0.7–4.0)
MCH: 30.6 pg (ref 26.0–34.0)
MCHC: 35 g/dL (ref 30.0–36.0)
MCV: 87.5 fL (ref 80.0–100.0)
Monocytes Absolute: 1.2 10*3/uL — ABNORMAL HIGH (ref 0.1–1.0)
Monocytes Relative: 9 %
Neutro Abs: 10.1 10*3/uL — ABNORMAL HIGH (ref 1.7–7.7)
Neutrophils Relative %: 79 %
Platelets: 192 10*3/uL (ref 150–400)
RBC: 4.41 MIL/uL (ref 3.87–5.11)
RDW: 13 % (ref 11.5–15.5)
WBC: 12.8 10*3/uL — ABNORMAL HIGH (ref 4.0–10.5)
nRBC: 0 % (ref 0.0–0.2)

## 2022-06-14 LAB — PROTIME-INR
INR: 1.5 — ABNORMAL HIGH (ref 0.8–1.2)
Prothrombin Time: 17.7 seconds — ABNORMAL HIGH (ref 11.4–15.2)

## 2022-06-14 LAB — RESP PANEL BY RT-PCR (RSV, FLU A&B, COVID)  RVPGX2
Influenza A by PCR: NEGATIVE
Influenza B by PCR: NEGATIVE
Resp Syncytial Virus by PCR: NEGATIVE
SARS Coronavirus 2 by RT PCR: NEGATIVE

## 2022-06-14 LAB — APTT: aPTT: 38 seconds — ABNORMAL HIGH (ref 24–36)

## 2022-06-14 LAB — BASIC METABOLIC PANEL
Anion gap: 9 (ref 5–15)
BUN: 11 mg/dL (ref 6–20)
CO2: 16 mmol/L — ABNORMAL LOW (ref 22–32)
Calcium: 8.3 mg/dL — ABNORMAL LOW (ref 8.9–10.3)
Chloride: 112 mmol/L — ABNORMAL HIGH (ref 98–111)
Creatinine, Ser: 1.3 mg/dL — ABNORMAL HIGH (ref 0.44–1.00)
GFR, Estimated: 58 mL/min — ABNORMAL LOW (ref >60)
Glucose, Bld: 139 mg/dL — ABNORMAL HIGH (ref 70–99)
Potassium: 5.5 mmol/L — ABNORMAL HIGH (ref 3.5–5.1)
Sodium: 137 mmol/L (ref 135–145)

## 2022-06-14 LAB — MAGNESIUM: Magnesium: 1.9 mg/dL (ref 1.7–2.4)

## 2022-06-14 LAB — GROUP A STREP BY PCR: Group A Strep by PCR: NOT DETECTED

## 2022-06-14 LAB — LACTIC ACID, PLASMA
Lactic Acid, Venous: 1 mmol/L (ref 0.5–1.9)
Lactic Acid, Venous: 1.4 mmol/L (ref 0.5–1.9)

## 2022-06-14 MED ORDER — ENOXAPARIN SODIUM 60 MG/0.6ML IJ SOSY
60.0000 mg | PREFILLED_SYRINGE | INTRAMUSCULAR | Status: DC
  Administered 2022-06-14 – 2022-06-16 (×3): 60 mg via SUBCUTANEOUS
  Filled 2022-06-14 (×4): qty 0.6

## 2022-06-14 MED ORDER — OXYCODONE HCL 5 MG PO TABS
5.0000 mg | ORAL_TABLET | ORAL | Status: DC | PRN
  Administered 2022-06-15 – 2022-06-16 (×4): 5 mg via ORAL
  Filled 2022-06-14 (×5): qty 1

## 2022-06-14 MED ORDER — SODIUM CHLORIDE 0.9 % IV SOLN
2.0000 g | INTRAVENOUS | Status: DC
  Administered 2022-06-14 – 2022-06-16 (×3): 2 g via INTRAVENOUS
  Filled 2022-06-14 (×3): qty 20

## 2022-06-14 MED ORDER — LACTATED RINGERS IV SOLN: INTRAVENOUS | Status: AC

## 2022-06-14 MED ORDER — KETOROLAC TROMETHAMINE 15 MG/ML IJ SOLN
15.0000 mg | Freq: Once | INTRAMUSCULAR | Status: AC
  Administered 2022-06-14: 15 mg via INTRAVENOUS
  Filled 2022-06-14: qty 1

## 2022-06-14 MED ORDER — ONDANSETRON HCL 4 MG/2ML IJ SOLN
4.0000 mg | Freq: Four times a day (QID) | INTRAMUSCULAR | Status: DC | PRN
  Administered 2022-06-15 – 2022-06-16 (×2): 4 mg via INTRAVENOUS
  Filled 2022-06-14 (×2): qty 2

## 2022-06-14 MED ORDER — IOHEXOL 300 MG/ML  SOLN
100.0000 mL | Freq: Once | INTRAMUSCULAR | Status: AC | PRN
  Administered 2022-06-14: 100 mL via INTRAVENOUS

## 2022-06-14 MED ORDER — TRAZODONE HCL 50 MG PO TABS
50.0000 mg | ORAL_TABLET | Freq: Every evening | ORAL | Status: DC | PRN
  Administered 2022-06-15 – 2022-06-16 (×3): 50 mg via ORAL
  Filled 2022-06-14 (×3): qty 1

## 2022-06-14 MED ORDER — ACETAMINOPHEN 325 MG PO TABS
650.0000 mg | ORAL_TABLET | Freq: Four times a day (QID) | ORAL | Status: DC | PRN
  Administered 2022-06-15 – 2022-06-16 (×3): 650 mg via ORAL
  Filled 2022-06-14 (×3): qty 2

## 2022-06-14 MED ORDER — SODIUM CHLORIDE 0.9 % IV BOLUS
1000.0000 mL | Freq: Once | INTRAVENOUS | Status: AC
  Administered 2022-06-14: 1000 mL via INTRAVENOUS

## 2022-06-14 MED ORDER — ONDANSETRON 4 MG PO TBDP
4.0000 mg | ORAL_TABLET | Freq: Once | ORAL | Status: AC
  Administered 2022-06-14: 4 mg via ORAL
  Filled 2022-06-14: qty 1

## 2022-06-14 MED ORDER — ACETAMINOPHEN 650 MG RE SUPP: 650.0000 mg | Freq: Four times a day (QID) | RECTAL | Status: DC | PRN

## 2022-06-14 MED ORDER — LACTATED RINGERS IV BOLUS (SEPSIS)
1000.0000 mL | Freq: Once | INTRAVENOUS | Status: AC
  Administered 2022-06-14: 1000 mL via INTRAVENOUS

## 2022-06-14 MED ORDER — SODIUM CHLORIDE (PF) 0.9 % IJ SOLN
INTRAMUSCULAR | Status: AC
  Filled 2022-06-14: qty 50

## 2022-06-14 MED ORDER — POTASSIUM CHLORIDE CRYS ER 20 MEQ PO TBCR
40.0000 meq | EXTENDED_RELEASE_TABLET | Freq: Once | ORAL | Status: AC
  Administered 2022-06-14: 40 meq via ORAL
  Filled 2022-06-14: qty 2

## 2022-06-14 MED ORDER — ACETAMINOPHEN 500 MG PO TABS
1000.0000 mg | ORAL_TABLET | Freq: Once | ORAL | Status: AC
  Administered 2022-06-14: 1000 mg via ORAL
  Filled 2022-06-14: qty 2

## 2022-06-14 MED ORDER — ALBUTEROL SULFATE (2.5 MG/3ML) 0.083% IN NEBU: 2.5000 mg | INHALATION_SOLUTION | Freq: Four times a day (QID) | RESPIRATORY_TRACT | Status: DC

## 2022-06-14 MED ORDER — FLUOXETINE HCL 10 MG PO CAPS
10.0000 mg | ORAL_CAPSULE | Freq: Every day | ORAL | Status: DC
  Administered 2022-06-15 – 2022-06-17 (×3): 10 mg via ORAL
  Filled 2022-06-14 (×3): qty 1

## 2022-06-14 MED ORDER — MORPHINE SULFATE (PF) 2 MG/ML IV SOLN
2.0000 mg | INTRAVENOUS | Status: DC | PRN
  Administered 2022-06-15 – 2022-06-16 (×4): 2 mg via INTRAVENOUS
  Filled 2022-06-14 (×4): qty 1

## 2022-06-14 MED ORDER — IBUPROFEN 800 MG PO TABS: 800.0000 mg | ORAL_TABLET | Freq: Once | ORAL | Status: DC

## 2022-06-14 MED ORDER — POTASSIUM CHLORIDE 10 MEQ/100ML IV SOLN
10.0000 meq | INTRAVENOUS | Status: AC
  Administered 2022-06-14 (×2): 10 meq via INTRAVENOUS
  Filled 2022-06-14 (×2): qty 100

## 2022-06-14 MED ORDER — HYDRALAZINE HCL 20 MG/ML IJ SOLN: 10.0000 mg | Freq: Four times a day (QID) | INTRAMUSCULAR | Status: DC | PRN

## 2022-06-14 MED ORDER — ONDANSETRON HCL 4 MG PO TABS
4.0000 mg | ORAL_TABLET | Freq: Four times a day (QID) | ORAL | Status: DC | PRN
  Administered 2022-06-17: 4 mg via ORAL
  Filled 2022-06-14: qty 1

## 2022-06-14 MED ORDER — ALBUTEROL SULFATE (2.5 MG/3ML) 0.083% IN NEBU
2.5000 mg | INHALATION_SOLUTION | Freq: Four times a day (QID) | RESPIRATORY_TRACT | Status: DC | PRN
  Administered 2022-06-14: 2.5 mg via RESPIRATORY_TRACT
  Filled 2022-06-14: qty 3

## 2022-06-14 MED ORDER — SENNA 8.6 MG PO TABS
1.0000 | ORAL_TABLET | Freq: Two times a day (BID) | ORAL | Status: DC
  Administered 2022-06-15 – 2022-06-16 (×2): 8.6 mg via ORAL
  Filled 2022-06-14 (×4): qty 1

## 2022-06-14 NOTE — ED Provider Triage Note (Signed)
Emergency Medicine Provider Triage Evaluation Note  Claudia Moon , a 26 y.o. female  was evaluated in triage.  Pt complains of severe nausea, vomiting body aches all over for the last few days.  Was seen at [facility] yesterday and diagnosed with pyelonephritis, provided wanted to get a CT scan, but patient had to leave secondary to her kids having to be picked up.  She endorses having fevers, chills, body aches.  Flank pain.  Has been taking   Ciprofloxacin, but cannot keep anything down.  Review of Systems  Positive: N/v/flank pain Negative: Chest pain, SOB  Physical Exam  There were no vitals taken for this visit. Gen:   Awake, diaphoretic Resp:  Normal effort  MSK:   Moves extremities without difficulty   Medical Decision Making  Medically screening exam initiated at 12:11 PM.  Appropriate orders placed.  Claudia Moon was informed that the remainder of the evaluation will be completed by another provider, this initial triage assessment does not replace that evaluation, and the importance of remaining in the ED until their evaluation is complete.    Pete Pelt, [place] 06/14/22 1213

## 2022-06-14 NOTE — Progress Notes (Addendum)
2045 patient admitted from ED alert x4 able to make all needs known ambulated to bed then bathroom. Complete assessment done orientated patient to room and bed provided patient with water juice and soup 06/15/22 0104 Patient alert and orientated patient temp elevated to 103.1 again called provided PRN tylenol given room temp turned all the way down and patient agreeable to take off both blankets she had on will retake temp in 1 hour. Updated Charge on Plan of Care at this time. Patient does not want ICE Discussed with patient concerns of seizure with such elevated temp. 0155 Patient lying on right side comfortable doesn't want top move vitals taken as is BP lower than prior most likely due to arm being above heart, temp is decreasing covers remain off only covered with sheet. Will continue to monitor. 0305 Temp decreasing slowly heart rate decreased respirations normal. MEWS now Yellow from RED 0355 Temp 102.3 new orders received patient does not want ICE packs still remains with only a sheet and room temp is at 55 degrees patient alert x 4  0500 temp decreased to 99.7 HR NSR 80s MEWS Green

## 2022-06-14 NOTE — Sepsis Progress Note (Signed)
Sepsis protocol is being followed by e[facility]. 

## 2022-06-14 NOTE — H&P (Signed)
[facility] History and Physical  Floye Fesler TIR:[id number] DOB: 05/28/1996 DOA: 06/14/2022 PCP: de Peru, Raymond J, MD  Admitted from: Home Chief Complaint: Right abdominal pain, nausea, vomiting  History of Present Illness: Claudia Moon is a 26 y.o. female with PMH of anxiety/depression, marijuana use 12/15, patient presented to the ED at [facility] with complaint of right lower quadrant abdominal pain with 2 days associated with nausea, vomiting and urinary frequency. She needed to leave and pick up her kids and hence CT scan could not be obtained.  She was given IV Rocephin and discharged on oral Levaquin.  She did take a dose of Levaquin last night Her symptoms worsened today and hence returned to the ED.    In the ED today, temperature was elevated to 103.2, heart rate elevated to 132, breathing on room air. Respiratory virus panel negative CT abdomen pelvis with contrast showed slightly thick-walled appearance of the urinary bladder with mild perivesical stranding, suspect for cystitis. Mild bilateral perinephric stranding with suggestion of mild urothelial enhancement of right ureter and probable focal hypoenhancement in the upper pole of the right kidney suspicious for pyelonephritis. No hydronephrosis. Urinalysis showed cloudy yellow urine with moderate amount of hemoglobin, large amount of leukocytes, few bacteria Blood culture and urine culture sent today (patient started on antibiotics yesterday) Started IV Rocephin today Admitted to [facility]  At the time of my evaluation, patient was lying down in bed.  Felt comfortable after Zofran was given. Last emesis was this afternoon in the ED.  Last bowel movement was watery at home today  Review of Systems:  All systems were reviewed and were negative unless otherwise mentioned in the HPI   Past medical history: Past Medical History:  Diagnosis Date   Anxiety state 11/20/2021   Depression    Late prenatal care affecting  pregnancy in second trimester 08/12/2018   Marijuana abuse, continuous 11/14/2021   Medical history non-contributory    Previous cesarean delivery, antepartum 08/12/2018   Plans TOLAC, consent signed    Supervision of normal pregnancy, antepartum 08/12/2018    Nursing Staff Provider Office Location  [facility]  Dating   LMP Language  ENGLISH  Anatomy US   f/u 4 weeks  Flu Vaccine  08/12/18 Genetic Screen  NIPS: ordered    TDaP vaccine    Hgb A1C or  GTT Early A1C 5.2 Third trimester: 72-132-115 Rhogam     LAB RESULTS  Feeding Plan Breast  Blood Type O/Positive/-- (02/13 1345)  Contraception IUD OR NEXPLANON Antibody Negative (02/13 1345) Circumcision Undec    Past surgical history: Past Surgical History:  Procedure Laterality Date   CESAREAN SECTION     FRACTURE SURGERY     multiple surgery from [facility]      Social History:  reports that she has never smoked. She has never used smokeless tobacco. She reports current alcohol use of about 1.0 - 2.0 standard drink of alcohol per week. She reports current drug use. Drug: Marijuana.  Allergies:  No Known Allergies Patient has no known allergies.   Family history:  Family History  Problem Relation Age of Onset   Stroke Mother    Hypertension Mother    Obesity Mother    Diabetes Maternal Grandmother    Diabetes Maternal Grandfather    Cancer Maternal Grandfather    Depression Sister    Obesity Sister      Home Meds: Prior to Admission medications   Medication Sig Start Date End Date Taking? Authorizing Provider  FLUoxetine (  PROZAC) 10 MG capsule Take 1 capsule (10 mg total) by mouth daily. 11/20/21 11/20/22  Nwoko, Tommas Olp, PA  levofloxacin (LEVAQUIN) 750 MG tablet Take 1 tablet (750 mg total) by mouth daily. 06/14/22   Cathren Laine, MD  traZODone (DESYREL) 50 MG tablet Take 1 tablet (50 mg total) by mouth at bedtime as needed for sleep. 11/20/21   Meta Hatchet, PA    Physical Exam: Vitals:   06/14/22 1345 06/14/22 1415 06/14/22  1430 06/14/22 1600  BP: 111/75 122/71 132/83   Pulse: (!) 102 97 98   Resp: (!) 29 18 17    Temp:    98.6 F (37 C)  TempSrc:    Oral  SpO2: 97% 99% 99%    Wt Readings from Last 3 Encounters:  06/13/22 117.9 kg  05/20/22 119.7 kg  08/12/21 130.2 kg   There is no height or weight on file to calculate BMI.  General exam: Pleasant, obese African-American female.  Lying on bed. Skin: No rashes, lesions or ulcers. HEENT: Atraumatic, normocephalic, no obvious bleeding Lungs: Clear to auscultation bilaterally CVS: Regular rate and rhythm, no murmur GI/Abd soft, tenderness present in right flank and right CVA distended from obesity, bowel sound present, CNS: Alert, awake, oriented x 3 Psychiatry: Mood appropriate Extremities: No pedal edema, no calf tenderness     Consult Orders  (From admission, onward)           Start     Ordered   06/14/22 1625  Consult to hospitalist  Once       Provider:  (Not yet assigned)  Question Answer Comment  Place call to: [facility] [id number] Pyelonephritis   Reason for Consult Admit      06/14/22 1625            Labs on Admission:   CBC: Recent Labs  Lab 06/13/22 0840 06/14/22 1314  WBC 10.7* 12.8*  NEUTROABS  --  10.1*  HGB 15.1* 13.5  HCT 44.9 38.6  MCV 89.6 87.5  PLT 258 192    Basic Metabolic Panel: Recent Labs  Lab 06/13/22 0840 06/14/22 1314 06/14/22 1505  NA 132* 132*  --   K 3.5 2.8*  --   CL 98 100  --   CO2 23 20*  --   GLUCOSE 95 96  --   BUN 9 11  --   CREATININE 0.93 0.86  --   CALCIUM 8.7* 8.6*  --   MG  --   --  1.9    Liver Function Tests: Recent Labs  Lab 06/13/22 0840 06/14/22 1314  AST 26 27  ALT 22 29  ALKPHOS 60 73  BILITOT 0.7 1.1  PROT 7.8 8.0  ALBUMIN 3.8 3.7   Recent Labs  Lab 06/13/22 0840  LIPASE 23   No results for input(s): "AMMONIA" in the last 168 hours.  Cardiac Enzymes: No results for input(s): "CKTOTAL", "CKMB", "CKMBINDEX", "TROPONINI" in the last 168  hours.  BNP (last 3 results) No results for input(s): "BNP" in the last 8760 hours.  ProBNP (last 3 results) No results for input(s): "PROBNP" in the last 8760 hours.  CBG: No results for input(s): "GLUCAP" in the last 168 hours.  Lipase     Component Value Date/Time   LIPASE 23 06/13/2022 0840     Urinalysis    Component Value Date/Time   COLORURINE YELLOW 06/13/2022 0916   APPEARANCEUR CLOUDY (A) 06/13/2022 0916   LABSPEC 1.014 06/13/2022 0916   PHURINE 6.0 06/13/2022  0916   GLUCOSEU NEGATIVE 06/13/2022 0916   HGBUR MODERATE (A) 06/13/2022 0916   BILIRUBINUR NEGATIVE 06/13/2022 0916   BILIRUBINUR Negative 05/20/2022 0940   KETONESUR 5 (A) 06/13/2022 0916   PROTEINUR 30 (A) 06/13/2022 0916   UROBILINOGEN 0.2 05/20/2022 0940   NITRITE NEGATIVE 06/13/2022 0916   LEUKOCYTESUR LARGE (A) 06/13/2022 0916     Drugs of Abuse  No results found for: "LABOPIA", "COCAINSCRNUR", "LABBENZ", "AMPHETMU", "THCU", "LABBARB"    Radiological Exams on Admission: CT ABDOMEN PELVIS W CONTRAST  Result Date: 06/14/2022 CLINICAL DATA:  Abdomen pain fever EXAM: CT ABDOMEN AND PELVIS WITH CONTRAST TECHNIQUE: Multidetector CT imaging of the abdomen and pelvis was performed using the standard protocol following bolus administration of intravenous contrast. RADIATION DOSE REDUCTION: This exam was performed according to the departmental dose-optimization program which includes automated exposure control, adjustment of the mA and/or kV according to patient size and/or use of iterative reconstruction technique. CONTRAST:  100mL OMNIPAQUE IOHEXOL 300 MG/ML  SOLN COMPARISON:  CT 05/03/2022 FINDINGS: Lower chest: No acute abnormality. Hepatobiliary: No focal liver abnormality is seen. No gallstones, gallbladder wall thickening, or biliary dilatation. Pancreas: Unremarkable. No pancreatic ductal dilatation or surrounding inflammatory changes. Spleen: Normal in size without focal abnormality. Adrenals/Urinary  Tract: Adrenal glands are normal. No hydronephrosis. Mild bilateral perinephric stranding. Suspicion of mild urothelial enhancement of right ureter. Probable focal hypoenhancement upper pole right kidney, series 2, image 23 and coronal series 4, image 102. Slightly thick-walled appearance of urinary bladder with mild perivesical stranding Stomach/Bowel: Stomach is within normal limits. Appendix appears normal. No evidence of bowel wall thickening, distention, or inflammatory changes. Vascular/Lymphatic: No significant vascular findings are present. No enlarged abdominal or pelvic lymph nodes. Reproductive: IUD in the uterus.  No suspicious adnexal mass. Other: Negative for pelvic effusion or free air. Small fat containing umbilical hernia Musculoskeletal: No acute or significant osseous findings. IMPRESSION: 1. Slightly thick-walled appearance of the urinary bladder with mild perivesical stranding, suspect for cystitis. Mild bilateral perinephric stranding with suggestion of mild urothelial enhancement of right ureter and probable focal hypoenhancement in the upper pole of the right kidney suspicious for pyelonephritis. No hydronephrosis. Electronically Signed   By: Jasmine PangKim  Fujinaga M.D.   On: 06/14/2022 16:12   DG Chest Port 1 View  Result Date: 06/14/2022 CLINICAL DATA:  Questionable sepsis. Severe nausea and vomiting. Body aches. EXAM: PORTABLE CHEST 1 VIEW COMPARISON:  None Available. FINDINGS: The heart size and mediastinal contours are within normal limits. Both lungs are clear. The visualized skeletal structures are unremarkable. IMPRESSION: No active disease. Electronically Signed   By: Marin Robertshristopher  Mattern M.D.   On: 06/14/2022 13:26     ------------------------------------------------------------------------------------------------------ Assessment/Plan: Principal Problem:   Acute pyelonephritis  Sepsis secondary to acute right pyelonephritis POA Presented with fever, tachycardia, leukocytosis,  right flank pain Urinalysis showed cloudy yellow urine with moderate amount of hemoglobin, large amount of leukocytes, few bacteria CT scan as above showing findings as above cystitis and right pyelonephritis without hydronephrosis, abscess Cultures sent Started on IV Rocephin. Tylenol for fever Continue to monitor temperature, WBC count Recent Labs  Lab 06/13/22 0840 06/14/22 1314 06/14/22 1328 06/14/22 1505  WBC 10.7* 12.8*  --   --   LATICACIDVEN  --   --  1.0 1.4   Anxiety/depression Continue fluoxetine, trazodone   Mobility: Encourage ambulation  Goals of care   Code Status: Full Code   Diet: Diet Order             Diet NPO  time specified  Diet effective now                   Nutritional status:  There is no height or weight on file to calculate BMI.       DVT prophylaxis: Lovenox subcu    Antimicrobials: IV Rocephin Fluid: Currently on LR Consultants: None Family Communication: None at bedside Dispo: The patient is from: Home              Anticipated d/c is to:  Hopefully home in 1 to 2 days              ------------------------------------------------------------------------------------- Severity of Illness: The appropriate patient status for this patient is INPATIENT. Inpatient status is judged to be reasonable and necessary in order to provide the required intensity of service to ensure the patient's safety. The patient's presenting symptoms, physical exam findings, and initial radiographic and laboratory data in the context of their chronic comorbidities is felt to place them at high risk for further clinical deterioration. Furthermore, it is not anticipated that the patient will be medically stable for discharge from the hospital within 2 midnights of admission.   * I certify that at the point of admission it is my clinical judgment that the patient will require inpatient hospital care spanning beyond 2 midnights from the point of admission due to  high intensity of service, high risk for further deterioration and high frequency of surveillance required.*   Signed, Lorin Glass, MD [facility] 06/14/2022

## 2022-06-14 NOTE — ED Triage Notes (Signed)
Pt arrived via EMS, from home, c/o n/v diarrhea. States similar sx for roommates.    1000mg  tylenol taken prior to arrival.

## 2022-06-14 NOTE — ED Provider Notes (Signed)
CT scan, confirmed cystitis with pyelonephritis.  And patient's symptoms are more so on the right side that looks like where most likely there is evidence of infection in the kidney.  Patient's septic workup looks reassuring with normal lactic acid.  And white count was less than 14,000.  Patient had pretty significant fever Tylenol seems to help that.  Also had fairly low potassium at 2.8 she did get some oral potassium.  Her potassium was normal yesterday.  Original plan was probably admission for treatment of pyelonephritis which I still think is appropriate based on the fact that she came in with pretty high fever fairly tachycardic.  Discussed with hospitalist for admission for pyelonephritis.  Patient did receive Rocephin already.  And urine cultures are pending.   Vanetta Mulders, MD 06/14/22 [phone]

## 2022-06-14 NOTE — ED Notes (Signed)
Pt called out d/t IV possibly leaking.  Connections tightened and new dressing applied.

## 2022-06-14 NOTE — Plan of Care (Signed)

## 2022-06-14 NOTE — ED Provider Notes (Signed)
[facility]-EMERGENCY DEPT Provider Note   CSN: [id number] Arrival date & time: 06/14/22  1154     History  Chief Complaint  Patient presents with   Abdominal Pain    Claudia Moon is a 26 y.o. female.  Patient is a 26 year old female who presents with fever and abdominal pain.  She started having pain in her right flank area about 3 days ago.  Its mostly in her back.  She has had associated nausea vomiting and diarrhea.  She has had fevers that started the last couple days.  She has a little bit of scratchiness in her throat.  No runny nose or congestion.  No cough or chest congestion.  She is having ounces.  She was seen yesterday in the emergency department and diagnosed with pyelonephritis.  She was given IV antibiotics including Rocephin and IV fluids.  She was discharged with a prescription for Levaquin.  Apparently they wanted to get a CT scan but patient had to leave to pick up her children and left.  She felt worse this morning so came back.  She did take a dose of her Levaquin last night.  She has some urinary frequency but no pain on urination.       Home Medications Prior to Admission medications   Medication Sig Start Date End Date Taking? Authorizing Provider  FLUoxetine (PROZAC) 10 MG capsule Take 1 capsule (10 mg total) by mouth daily. 11/20/21 11/20/22  Nwoko, Tommas Olp, PA  levofloxacin (LEVAQUIN) 750 MG tablet Take 1 tablet (750 mg total) by mouth daily. 06/14/22   Cathren Laine, MD  traZODone (DESYREL) 50 MG tablet Take 1 tablet (50 mg total) by mouth at bedtime as needed for sleep. 11/20/21   Meta Hatchet, PA      Allergies    Patient has no known allergies.    Review of Systems   Review of Systems  Constitutional:  Positive for chills, fatigue and fever. Negative for diaphoresis.  HENT:  Positive for sore throat. Negative for congestion, rhinorrhea and sneezing.   Eyes: Negative.   Respiratory:  Negative for cough, chest tightness  and shortness of breath.   Cardiovascular:  Negative for chest pain and leg swelling.  Gastrointestinal:  Positive for diarrhea, nausea and vomiting. Negative for abdominal pain and blood in stool.  Genitourinary:  Negative for difficulty urinating, flank pain, frequency and hematuria.  Musculoskeletal:  Positive for myalgias. Negative for arthralgias and back pain.  Skin:  Negative for rash.  Neurological:  Positive for headaches. Negative for dizziness, speech difficulty, weakness and numbness.    Physical Exam Updated Vital Signs BP 132/83   Pulse 98   Temp (!) 103.2 F (39.6 C) (Oral)   Resp 17   LMP 05/15/2022 (Approximate)   SpO2 99%  Physical Exam Constitutional:      Appearance: She is well-developed.  HENT:     Head: Normocephalic and atraumatic.     Mouth/Throat:     Comments: Mild erythema to the posterior pharynx, no exudates coming uvula is midline, no trismus Eyes:     Pupils: Pupils are equal, round, and reactive to light.  Neck:     Comments: No meningismus Cardiovascular:     Rate and Rhythm: Regular rhythm. Tachycardia present.     Heart sounds: Normal heart sounds.  Pulmonary:     Effort: Pulmonary effort is normal. No respiratory distress.     Breath sounds: Normal breath sounds. No wheezing or rales.  Chest:  Chest wall: No tenderness.  Abdominal:     General: Bowel sounds are normal.     Palpations: Abdomen is soft.     Tenderness: There is abdominal tenderness (Right flank). There is no guarding or rebound.  Musculoskeletal:        General: Normal range of motion.     Cervical back: Normal range of motion and neck supple.  Lymphadenopathy:     Cervical: No cervical adenopathy.  Skin:    General: Skin is warm and dry.     Findings: No rash.  Neurological:     Mental Status: She is alert and oriented to person, place, and time.     ED Results / Procedures / Treatments   Labs (all labs ordered are listed, but only abnormal results are  displayed) Labs Reviewed  CBC WITH DIFFERENTIAL/PLATELET - Abnormal; Notable for the following components:      Result Value   WBC 12.8 (*)    Neutro Abs 10.1 (*)    Monocytes Absolute 1.2 (*)    Abs Immature Granulocytes 0.10 (*)    All other components within normal limits  COMPREHENSIVE METABOLIC PANEL - Abnormal; Notable for the following components:   Sodium 132 (*)    Potassium 2.8 (*)    CO2 20 (*)    Calcium 8.6 (*)    All other components within normal limits  PROTIME-INR - Abnormal; Notable for the following components:   Prothrombin Time 17.7 (*)    INR 1.5 (*)    All other components within normal limits  APTT - Abnormal; Notable for the following components:   aPTT 38 (*)    All other components within normal limits  GROUP A STREP BY PCR  CULTURE, BLOOD (ROUTINE X 2)  CULTURE, BLOOD (ROUTINE X 2)  URINE CULTURE  RESP PANEL BY RT-PCR (RSV, FLU A&B, COVID)  RVPGX2  LACTIC ACID, PLASMA  LACTIC ACID, PLASMA  MAGNESIUM  I-STAT BETA HCG BLOOD, ED ([facility], [facility], [facility] ONLY)    EKG EKG Interpretation  Date/Time:  Saturday June 14 2022 13:04:40 EST Ventricular Rate:  106 PR Interval:  153 QRS Duration: 88 QT Interval:  310 QTC Calculation: 412 R Axis:   17 Text Interpretation: Sinus tachycardia Borderline T wave abnormalities Confirmed by Rolan Bucco [phone]) on 06/14/2022 1:18:56 PM  Radiology DG Chest Port 1 View  Result Date: 06/14/2022 CLINICAL DATA:  Questionable sepsis. Severe nausea and vomiting. Body aches. EXAM: PORTABLE CHEST 1 VIEW COMPARISON:  None Available. FINDINGS: The heart size and mediastinal contours are within normal limits. Both lungs are clear. The visualized skeletal structures are unremarkable. IMPRESSION: No active disease. Electronically Signed   By: Marin Roberts M.D.   On: 06/14/2022 13:26    Procedures Procedures    Medications Ordered in ED Medications  lactated ringers infusion ( Intravenous New Bag/Given 06/14/22 1403)   cefTRIAXone (ROCEPHIN) 2 g in sodium chloride 0.9 % 100 mL IVPB (2 g Intravenous New Bag/Given 06/14/22 1358)  potassium chloride 10 mEq in 100 mL IVPB (10 mEq Intravenous New Bag/Given 06/14/22 1501)  ondansetron (ZOFRAN-ODT) disintegrating tablet 4 mg (4 mg Oral Given 06/14/22 1341)  sodium chloride 0.9 % bolus 1,000 mL (1,000 mLs Intravenous New Bag/Given 06/14/22 1356)  ketorolac (TORADOL) 15 MG/ML injection 15 mg (15 mg Intravenous Given 06/14/22 1342)  lactated ringers bolus 1,000 mL (1,000 mLs Intravenous New Bag/Given 06/14/22 1402)  potassium chloride SA (KLOR-CON M) CR tablet 40 mEq (40 mEq Oral Given 06/14/22 1454)  acetaminophen (  TYLENOL) tablet 1,000 mg (1,000 mg Oral Given 06/14/22 1454)    ED Course/ Medical Decision Making/ A&P                           Medical Decision Making Amount and/or Complexity of Data Reviewed Labs: ordered. Radiology: ordered. ECG/medicine tests: ordered.  Risk OTC drugs. Prescription drug management.   Patient is a 26 year old female who presents with right flank pain and some urinary frequency.  She also was febrile.  She is tachycardic on arrival.  She was treated yesterday with IV Rocephin for UTI/pyelonephritis and also started on Levaquin for which she took 1 dose.  She is having worsening symptoms today with nausea and vomiting and ongoing fevers.  She was started on septic protocol on arrival.  She was given IV fluids and antibiotics.  Her lactate is normal.  Her WBC count is elevated.  Her potassium is low.  She was started on potassium supplementation.  She is awaiting CT abdomen pelvis.  Will likely need admission.  Care turned over to Dr. Deretha Emory pending CT.  CRITICAL CARE Performed by: Rolan Bucco Total critical care time: 70 minutes Critical care time was exclusive of separately billable procedures and treating other patients. Critical care was necessary to treat or prevent imminent or life-threatening  deterioration. Critical care was time spent personally by me on the following activities: development of treatment plan with patient and/or surrogate as well as nursing, discussions with consultants, evaluation of patient's response to treatment, examination of patient, obtaining history from patient or surrogate, ordering and performing treatments and interventions, ordering and review of laboratory studies, ordering and review of radiographic studies, pulse oximetry and re-evaluation of patient's condition.   Final Clinical Impression(s) / ED Diagnoses Final diagnoses:  Pyelonephritis  Sepsis without acute organ dysfunction, due to unspecified organism [facility])    Rx / DC Orders ED Discharge Orders     None         Rolan Bucco, MD 06/14/22 1519

## 2022-06-15 DIAGNOSIS — N10 Acute tubulo-interstitial nephritis: Secondary | ICD-10-CM | POA: Diagnosis not present

## 2022-06-15 LAB — BASIC METABOLIC PANEL
Anion gap: 10 (ref 5–15)
BUN: 8 mg/dL (ref 6–20)
CO2: 19 mmol/L — ABNORMAL LOW (ref 22–32)
Calcium: 7.9 mg/dL — ABNORMAL LOW (ref 8.9–10.3)
Chloride: 104 mmol/L (ref 98–111)
Creatinine, Ser: 0.68 mg/dL (ref 0.44–1.00)
GFR, Estimated: >60 mL/min (ref >60)
Glucose, Bld: 86 mg/dL (ref 70–99)
Potassium: 3.4 mmol/L — ABNORMAL LOW (ref 3.5–5.1)
Sodium: 133 mmol/L — ABNORMAL LOW (ref 135–145)

## 2022-06-15 LAB — CBC
HCT: 33.8 % — ABNORMAL LOW (ref 36.0–46.0)
Hemoglobin: 11.1 g/dL — ABNORMAL LOW (ref 12.0–15.0)
MCH: 30.1 pg (ref 26.0–34.0)
MCHC: 32.8 g/dL (ref 30.0–36.0)
MCV: 91.6 fL (ref 80.0–100.0)
Platelets: 179 10*3/uL (ref 150–400)
RBC: 3.69 MIL/uL — ABNORMAL LOW (ref 3.87–5.11)
RDW: 13.4 % (ref 11.5–15.5)
WBC: 5.5 10*3/uL (ref 4.0–10.5)
nRBC: 0 % (ref 0.0–0.2)

## 2022-06-15 LAB — HIV ANTIBODY (ROUTINE TESTING W REFLEX): HIV Screen 4th Generation wRfx: NONREACTIVE

## 2022-06-15 MED ORDER — IBUPROFEN 200 MG PO TABS
400.0000 mg | ORAL_TABLET | Freq: Once | ORAL | Status: AC
  Administered 2022-06-15: 400 mg via ORAL
  Filled 2022-06-15: qty 2

## 2022-06-15 MED ORDER — POTASSIUM CHLORIDE CRYS ER 20 MEQ PO TBCR
40.0000 meq | EXTENDED_RELEASE_TABLET | Freq: Once | ORAL | Status: AC
  Administered 2022-06-15: 40 meq via ORAL
  Filled 2022-06-15: qty 2

## 2022-06-15 NOTE — Progress Notes (Signed)
PROGRESS NOTE  Claudia Moon  DOB: March 27, 1996  PCP: de [place], Raymond J, MD DGL:[id number]  DOA: 06/14/2022  LOS: 1 day  Hospital Day: 2  Brief narrative: Claudia Moon is a 26 y.o. female with PMH of anxiety/depression, marijuana use 12/15, patient presented to the ED at [facility] with complaint of right lower quadrant abdominal pain with 2 days associated with nausea, vomiting and urinary frequency. She needed to leave and pick up her kids and hence CT scan could not be obtained.  She was given IV Rocephin and discharged on oral Levaquin.  She did take a dose of Levaquin last night Her symptoms worsened today and hence returned to the ED.    In the ED today, temperature was elevated to 103.2, heart rate elevated to 132, breathing on room air. Respiratory virus panel negative CT abdomen pelvis with contrast showed slightly thick-walled appearance of the urinary bladder with mild perivesical stranding, suspect for cystitis. Mild bilateral perinephric stranding with suggestion of mild urothelial enhancement of right ureter and probable focal hypoenhancement in the upper pole of the right kidney suspicious for pyelonephritis. No hydronephrosis. Urinalysis showed cloudy yellow urine with moderate amount of hemoglobin, large amount of leukocytes, few bacteria Blood culture and urine culture sent today (patient started on antibiotics yesterday) Started IV Rocephin today Admitted to [facility]  Subjective: Patient was seen and examined this morning.  Pleasant young African-American female.  Not in distress. Feels better than at presentation.  Last fever episode was 3:55 this morning, temperature 102.3  Assessment/Plan: Sepsis secondary to acute right pyelonephritis POA Presented with fever, tachycardia, leukocytosis, right flank pain Urinalysis showed cloudy yellow urine with moderate amount of hemoglobin, large amount of leukocytes, few bacteria CT scan as above showing findings as above cystitis  and right pyelonephritis without hydronephrosis, abscess Currently improving on IV Rocephin. Pending blood culture and urine culture report Last fever episode was this morning, 102.3 WBC count improved to normal. Continue to monitor temperature, WBC count Recent Labs  Lab 06/13/22 0840 06/14/22 1314 06/14/22 1328 06/14/22 1505 06/15/22 0425  WBC 10.7* 12.8*  --   --  5.5  LATICACIDVEN  --   --  1.0 1.4  --    Anxiety/depression Continue fluoxetine, trazodone  Mobility: Encourage ambulation  Goals of care   Code Status: Full Code   Diet: Diet Order             Diet regular Room service appropriate? Yes; Fluid consistency: Thin  Diet effective now                   Nutritional status:  Body mass index is 39.45 kg/m.       DVT prophylaxis: Lovenox subcu   Antimicrobials: IV Rocephin Fluid: Currently on LR Consultants: None Family Communication: None at bedside  Status is: Inpatient  Continue in-hospital care because: Continues to have fever, needs IV antibiotics and further monitoring Level of care: Med-Surg   Dispo: The patient is from: Home              Anticipated d/c is to: Home              Patient currently is not medically stable to d/c.   Difficult to place patient No  Infusions:   cefTRIAXone (ROCEPHIN)  IV 2 g (06/15/22 1311)    Scheduled Meds:  enoxaparin (LOVENOX) injection  60 mg Subcutaneous Q24H   FLUoxetine  10 mg Oral Daily   senna  1 tablet Oral BID  PRN meds: acetaminophen **OR** acetaminophen, albuterol, hydrALAZINE, morphine injection, ondansetron **OR** ondansetron (ZOFRAN) IV, oxyCODONE, traZODone   Antimicrobials: Anti-infectives (From admission, onward)    Start     Dose/Rate Route Frequency Ordered Stop   06/14/22 1300  cefTRIAXone (ROCEPHIN) 2 g in sodium chloride 0.9 % 100 mL IVPB        2 g 200 mL/hr over 30 Minutes Intravenous Every 24 hours 06/14/22 1255 06/21/22 1259       Objective: Vitals:    06/15/22 0927 06/15/22 1310  BP: 111/76 103/74  Pulse: 90 92  Resp: 18 20  Temp: 98.2 F (36.8 C) 99.7 F (37.6 C)  SpO2: 100% 100%    Intake/Output Summary (Last 24 hours) at 06/15/2022 1415 Last data filed at 06/15/2022 0800 Gross per 24 hour  Intake 4718.87 ml  Output --  Net 4718.87 ml   Filed Weights   06/14/22 2041 06/14/22 2326  Weight: 117.7 kg 117.7 kg   Weight change:  Body mass index is 39.45 kg/m.   Physical Exam: General exam: Pleasant, obese African-American female.  Lying on bed. Skin: No rashes, lesions or ulcers. HEENT: Atraumatic, normocephalic, no obvious bleeding Lungs: Clear to auscultation bilaterally CVS: Regular rate and rhythm, no murmur GI/Abd soft, tenderness improving in right flank and right CVA, distended from obesity, bowel sound present, CNS: Alert, awake, oriented x 3 Psychiatry: Mood appropriate Extremities: No pedal edema, no calf tenderness  Data Review: I have personally reviewed the laboratory data and studies available.  F/u labs ordered Unresulted Labs (From admission, onward)     Start     Ordered   06/16/22 0500  CBC with Differential/Platelet  Tomorrow morning,   R       Question:  Specimen collection method  Answer:  Lab=Lab collect   06/15/22 1410   06/16/22 0500  Basic metabolic panel  Tomorrow morning,   R       Question:  Specimen collection method  Answer:  Lab=Lab collect   06/15/22 1410            Signed, Lorin Glass, MD [facility] 06/15/2022

## 2022-06-15 NOTE — Progress Notes (Incomplete Revision)
1920 patient alert x 4 able to make all needs known   06/16/22 0309 patient request pain medication vitals taken low grade temp PRN tylenol given patient back in bed after ambulating to bathroom HR elevated raising MEWS to yellow after 15 mins being back in bed and post pain medication HR WNL lowing MEWS back to green 0445 patient heat rate increases with activity 120s while in bathroom bathing and brushing teeth

## 2022-06-15 NOTE — Progress Notes (Signed)
Temp 103.1 again, Dr Pola Corn notified. Orders for tylenol given.

## 2022-06-15 NOTE — Progress Notes (Signed)
1920 patient alert x 4 able to make all needs known   06/16/22 0309 patient request pain medication vitals taken low grade temp PRN tylenol given patient back in bed after ambulating to bathroom HR elevated raising MEWS to yellow after 15 mins being back in bed and post pain medication HR WNL lowing MEWS back to green 0445 patient heat rate increases with activity 120s while in bathroom bathing and brushing teeth

## 2022-06-16 DIAGNOSIS — N10 Acute tubulo-interstitial nephritis: Secondary | ICD-10-CM | POA: Diagnosis not present

## 2022-06-16 LAB — CBC WITH DIFFERENTIAL/PLATELET
Abs Immature Granulocytes: 0.03 10*3/uL (ref 0.00–0.07)
Basophils Absolute: 0 10*3/uL (ref 0.0–0.1)
Basophils Relative: 0 %
Eosinophils Absolute: 0 10*3/uL (ref 0.0–0.5)
Eosinophils Relative: 1 %
HCT: 36.2 % (ref 36.0–46.0)
Hemoglobin: 12 g/dL (ref 12.0–15.0)
Immature Granulocytes: 1 %
Lymphocytes Relative: 32 %
Lymphs Abs: 1.8 10*3/uL (ref 0.7–4.0)
MCH: 29.9 pg (ref 26.0–34.0)
MCHC: 33.1 g/dL (ref 30.0–36.0)
MCV: 90 fL (ref 80.0–100.0)
Monocytes Absolute: 0.8 10*3/uL (ref 0.1–1.0)
Monocytes Relative: 14 %
Neutro Abs: 3 10*3/uL (ref 1.7–7.7)
Neutrophils Relative %: 52 %
Platelets: 208 10*3/uL (ref 150–400)
RBC: 4.02 MIL/uL (ref 3.87–5.11)
RDW: 13.4 % (ref 11.5–15.5)
WBC: 5.7 10*3/uL (ref 4.0–10.5)
nRBC: 0 % (ref 0.0–0.2)

## 2022-06-16 LAB — BASIC METABOLIC PANEL
Anion gap: 9 (ref 5–15)
BUN: 7 mg/dL (ref 6–20)
CO2: 20 mmol/L — ABNORMAL LOW (ref 22–32)
Calcium: 8.2 mg/dL — ABNORMAL LOW (ref 8.9–10.3)
Chloride: 104 mmol/L (ref 98–111)
Creatinine, Ser: 0.6 mg/dL (ref 0.44–1.00)
GFR, Estimated: >60 mL/min (ref >60)
Glucose, Bld: 90 mg/dL (ref 70–99)
Potassium: 3.6 mmol/L (ref 3.5–5.1)
Sodium: 133 mmol/L — ABNORMAL LOW (ref 135–145)

## 2022-06-16 NOTE — Progress Notes (Addendum)
PROGRESS NOTE  Claudia Moon  DOB: 07/04/1995  PCP: de Moon, Claudia J, MD ZHY:[id number]  DOA: 06/14/2022  LOS: 2 days  Hospital Day: 3  Brief narrative: Claudia Moon is a 26 y.o. female with PMH of anxiety/depression, marijuana use 12/15, patient presented to the ED at [facility] with complaint of right lower quadrant abdominal pain with 2 days associated with nausea, vomiting and urinary frequency. She needed to leave and pick up her kids and hence CT scan could not be obtained.  She was given IV Rocephin and discharged on oral Levaquin.  She did take a dose of Levaquin last night Her symptoms worsened today and hence returned to the ED.    In the ED today, temperature was elevated to 103.2, heart rate elevated to 132, breathing on room air. Respiratory virus panel negative CT abdomen pelvis with contrast showed slightly thick-walled appearance of the urinary bladder with mild perivesical stranding, suspect for cystitis. Mild bilateral perinephric stranding with suggestion of mild urothelial enhancement of right ureter and probable focal hypoenhancement in the upper pole of the right kidney suspicious for pyelonephritis. No hydronephrosis. Urinalysis showed cloudy yellow urine with moderate amount of hemoglobin, large amount of leukocytes, few bacteria Blood culture and urine culture sent today (patient started on antibiotics yesterday) Started IV Rocephin today Admitted to [facility]  Subjective: Patient was seen and examined this afternoon. Fever spikes improving, Tmax 103.1 last night. Right flank pain and CVA tenderness still there  Assessment/Plan: Sepsis secondary to acute right pyelonephritis POA Presented with fever, tachycardia, leukocytosis, right flank pain Urinalysis showed cloudy yellow urine with moderate amount of hemoglobin, large amount of leukocytes, few bacteria CT scan as above showing findings as above cystitis and right pyelonephritis without hydronephrosis,  abscess Currently improving on IV Rocephin.  But continues to have episodes of fever spike and CVA tenderness No growth in blood culture urine culture so far. Recent Labs  Lab 06/13/22 0840 06/14/22 1314 06/14/22 1328 06/14/22 1505 06/15/22 0425 06/16/22 0436  WBC 10.7* 12.8*  --   --  5.5 5.7  LATICACIDVEN  --   --  1.0 1.4  --   --    Hyponatremia Sodium level low at 133 today.  Continue to monitor Recent Labs  Lab 06/13/22 0840 06/14/22 1314 06/14/22 2317 06/15/22 0425 06/16/22 0436  NA 132* 132* 137 133* 133*    Anxiety/depression Continue fluoxetine, trazodone  Mobility: Encourage ambulation  Goals of care   Code Status: Full Code   Diet: Diet Order             Diet regular Room service appropriate? Yes; Fluid consistency: Thin  Diet effective now                   Nutritional status:  Body mass index is 39.45 kg/m.       DVT prophylaxis: Lovenox subcu   Antimicrobials: IV Rocephin Fluid: Currently on LR Consultants: None Family Communication: None at bedside  Status is: Inpatient  Continue in-hospital care because: Continues to have fever, needs IV antibiotics and further monitoring Level of care: Med-Surg   Dispo: The patient is from: Home              Anticipated d/c is to: Home              Patient currently is not medically stable to d/c.   Difficult to place patient No  Infusions:   cefTRIAXone (ROCEPHIN)  IV 2 g (06/16/22 1335)  Scheduled Meds:  enoxaparin (LOVENOX) injection  60 mg Subcutaneous Q24H   FLUoxetine  10 mg Oral Daily   senna  1 tablet Oral BID    PRN meds: acetaminophen **OR** acetaminophen, albuterol, hydrALAZINE, morphine injection, ondansetron **OR** ondansetron (ZOFRAN) IV, oxyCODONE, traZODone   Antimicrobials: Anti-infectives (From admission, onward)    Start     Dose/Rate Route Frequency Ordered Stop   06/14/22 1300  cefTRIAXone (ROCEPHIN) 2 g in sodium chloride 0.9 % 100 mL IVPB        2 g 200  mL/hr over 30 Minutes Intravenous Every 24 hours 06/14/22 1255 06/21/22 1259       Objective: Vitals:   06/16/22 0500 06/16/22 1228  BP:  110/68  Pulse:  65  Resp:  17  Temp: 98.7 F (37.1 C) 98.9 F (37.2 C)  SpO2:  100%    Intake/Output Summary (Last 24 hours) at 06/16/2022 1445 Last data filed at 06/16/2022 0318 Gross per 24 hour  Intake 820 ml  Output --  Net 820 ml   Filed Weights   06/14/22 2041 06/14/22 2326  Weight: 117.7 kg 117.7 kg   Weight change:  Body mass index is 39.45 kg/m.   Physical Exam: General exam: Pleasant, obese African-American female.  Lying on bed. Skin: No rashes, lesions or ulcers. HEENT: Atraumatic, normocephalic, no obvious bleeding Lungs: Clear to auscultation bilaterally CVS: Regular rate and rhythm, no murmur GI/Abd: soft, tenderness, continues to have right flank and right CVA, distended from obesity, bowel sound present. CNS: Alert, awake, oriented x 3 Psychiatry: Mood appropriate Extremities: No pedal edema, no calf tenderness  Data Review: I have personally reviewed the laboratory data and studies available.  F/u labs ordered Unresulted Labs (From admission, onward)    None       Signed, Lorin Glass, MD [facility] 06/16/2022

## 2022-06-17 DIAGNOSIS — N10 Acute tubulo-interstitial nephritis: Secondary | ICD-10-CM | POA: Diagnosis not present

## 2022-06-17 MED ORDER — OXYCODONE HCL 5 MG PO TABS: 5.0000 mg | ORAL_TABLET | Freq: Four times a day (QID) | ORAL | 0 refills | Status: AC | PRN

## 2022-06-17 MED ORDER — CEFADROXIL 500 MG PO CAPS
1000.0000 mg | ORAL_CAPSULE | Freq: Two times a day (BID) | ORAL | Status: DC
  Administered 2022-06-17: 1000 mg via ORAL
  Filled 2022-06-17: qty 1
  Filled 2022-06-17: qty 2

## 2022-06-17 MED ORDER — ONDANSETRON HCL 4 MG PO TABS: 4.0000 mg | ORAL_TABLET | Freq: Four times a day (QID) | ORAL | 0 refills | Status: AC | PRN

## 2022-06-17 MED ORDER — SACCHAROMYCES BOULARDII 250 MG PO CAPS: 250.0000 mg | ORAL_CAPSULE | Freq: Two times a day (BID) | ORAL | 0 refills | Status: AC

## 2022-06-17 MED ORDER — CEFADROXIL 500 MG PO CAPS: 1000.0000 mg | ORAL_CAPSULE | Freq: Two times a day (BID) | ORAL | 0 refills | Status: AC

## 2022-06-17 MED ORDER — CEFADROXIL 500 MG PO CAPS
500.0000 mg | ORAL_CAPSULE | Freq: Two times a day (BID) | ORAL | Status: DC
  Filled 2022-06-17: qty 1

## 2022-06-17 MED ORDER — ORAL CARE MOUTH RINSE: 15.0000 mL | OROMUCOSAL | Status: DC | PRN

## 2022-06-17 NOTE — TOC Transition Note (Signed)
Transition of Care [facility]) - CM/SW Discharge Note   Patient Details  Name: Claudia Moon MRN: [id number] Date of Birth: [birth year]  Transition of Care [facility]) CM/SW Contact:  Golda Acre, RN Phone Number: 06/17/2022, 12:05 PM   Clinical Narrative:    Patient discharged to return home.   Final next level of care: Home/Self Care Barriers to Discharge: Barriers Resolved   Patient Goals and CMS Choice Patient states their goals for this hospitalization and ongoing recovery are:: to go home CMS Medicare.gov Compare Post Acute Care list provided to:: Patient Choice offered to / list presented to : Patient  Discharge Placement                       Discharge Plan and Services   Discharge Planning Services: CM Consult                                 Social Determinants of Health (SDOH) Interventions     Readmission Risk Interventions   No data to display

## 2022-06-17 NOTE — TOC Initial Note (Signed)
Transition of Care [facility]) - Initial/Assessment Note    Patient Details  Name: Claudia Moon MRN: [id number] Date of Birth: Nov 05, 1995  Transition of Care [facility]) CM/SW Contact:    Golda Acre, RN Phone Number: 06/17/2022, 8:50 AM  Clinical Narrative:                  Transition of Care [facility]) Screening Note   Patient Details  Name: Claudia Moon Date of Birth: [birth year]   Transition of Care [facility]) CM/SW Contact:    Golda Acre, RN Phone Number: 06/17/2022, 8:50 AM    Transition of Care Department [facility]) has reviewed patient and no TOC needs have been identified at this time. We will continue to monitor patient advancement through interdisciplinary progression rounds. If new patient transition needs arise, please place a TOC consult.    Expected Discharge Plan: Home/Self Care Barriers to Discharge: Continued Medical Work up   Patient Goals and CMS Choice Patient states their goals for this hospitalization and ongoing recovery are:: to go home CMS Medicare.gov Compare Post Acute Care list provided to:: Patient Choice offered to / list presented to : Patient  Expected Discharge Plan and Services Expected Discharge Plan: Home/Self Care   Discharge Planning Services: CM Consult   Living arrangements for the past 2 months: Apartment                                      Prior Living Arrangements/Services Living arrangements for the past 2 months: Apartment Lives with:: Self Patient language and need for interpreter reviewed:: Yes Do you feel safe going back to the place where you live?: Yes            Criminal Activity/Legal Involvement Pertinent to Current Situation/Hospitalization: No - Comment as needed  Activities of Daily Living Home Assistive Devices/Equipment: None ADL Screening (condition at time of admission) Patient's cognitive ability adequate to safely complete daily activities?: Yes Is the patient deaf or have difficulty  hearing?: No Does the patient have difficulty seeing, even when wearing glasses/contacts?: No Does the patient have difficulty concentrating, remembering, or making decisions?: No Patient able to express need for assistance with ADLs?: Yes Does the patient have difficulty dressing or bathing?: No Independently performs ADLs?: Yes (appropriate for developmental age) Does the patient have difficulty walking or climbing stairs?: No Weakness of Legs: None Weakness of Arms/Hands: None  Permission Sought/Granted                  Emotional Assessment Appearance:: Appears stated age Attitude/Demeanor/Rapport: Engaged Affect (typically observed): Calm Orientation: : Oriented to Self, Oriented to Place, Oriented to  Time, Oriented to Situation Alcohol / Substance Use: Not Applicable Psych Involvement: No (comment)  Admission diagnosis:  Pyelonephritis [N12] Acute pyelonephritis [N10] Sepsis without acute organ dysfunction, due to unspecified organism [facility]) [A41.9] Patient Active Problem List   Diagnosis Date Noted   Acute pyelonephritis 06/14/2022   Dysuria 05/20/2022   Rectal bleeding 05/20/2022   Irregular menstrual bleeding 05/20/2022   Anxiety state 11/20/2021   Moderate episode of recurrent major depressive disorder (HCC) 11/20/2021   Sleep disturbances 11/20/2021   Substance induced mood disorder (HCC) 11/14/2021   Marijuana abuse, continuous 11/14/2021   Abnormal weight gain 08/12/2021   Severe obesity (BMI >= 40) (HCC) 08/12/2021   Phase of life problem 08/12/2021   Intrauterine pregnancy 12/23/2018   VBAC (vaginal birth after Cesarean) 12/23/2018  Obesity during pregnancy, antepartum 08/12/2018   Maternal varicella, non-immune 10/03/2016   PCP:  de [place], Raymond J, MD Pharmacy:   [facility] [phone] [place], [place] - [address] AT [facility] OF [facility] & [address] [place] [address] Phone: [phone] Fax:  [phone]  [id number] [place], [place] - [address] AT [facility] OF [facility] & [address] [place] [address] Phone: [phone] Fax: [phone]  [id number] - [place], [place] - [address] AT [facility] OF [address] [facility] [address] & [address] [place] [address] Phone: [phone] Fax: [phone]     Social Determinants of Health (SDOH) Interventions    Readmission Risk Interventions   No data to display

## 2022-06-17 NOTE — Discharge Summary (Signed)
Physician Discharge Summary  Kalika Patraw [id number] DOB: April 14, 1996 DOA: 06/14/2022  PCP: de [place], Raymond J, MD  Admit date: 06/14/2022 Discharge date: 06/17/2022  Admitted From: Home Discharge disposition: Home  Recommendations at discharge:  7 more days of oral cefadroxil. As needed tramadol for pain control.  Brief narrative: Claudia Moon is a 26 y.o. female with PMH of anxiety/depression, marijuana use 12/15, patient presented to the ED at [facility] with complaint of right lower quadrant abdominal pain with 2 days associated with nausea, vomiting and urinary frequency. She needed to leave and pick up her kids and hence CT scan could not be obtained.  She was given IV Rocephin and discharged on oral Levaquin.  She did take a dose of Levaquin last night Her symptoms worsened today and hence returned to the ED.    In the ED today, temperature was elevated to 103.2, heart rate elevated to 132, breathing on room air. Respiratory virus panel negative CT abdomen pelvis with contrast showed slightly thick-walled appearance of the urinary bladder with mild perivesical stranding, suspect for cystitis. Mild bilateral perinephric stranding with suggestion of mild urothelial enhancement of right ureter and probable focal hypoenhancement in the upper pole of the right kidney suspicious for pyelonephritis. No hydronephrosis. Urinalysis showed cloudy yellow urine with moderate amount of hemoglobin, large amount of leukocytes, few bacteria Blood culture and urine culture sent today (patient started on antibiotics yesterday) Started IV Rocephin today Admitted to [facility]  Subjective: Patient was seen and examined this morning. Feeling better.  No fever last 24 hours.  Right flank pain gradually improving.  Assessment/Plan: Sepsis secondary to acute right pyelonephritis POA Presented with fever, tachycardia, leukocytosis, right flank pain Urinalysis showed cloudy yellow urine with moderate  amount of hemoglobin, large amount of leukocytes, few bacteria CT scan as above showing findings as above cystitis and right pyelonephritis without hydronephrosis or abscess. Currently improving on IV Rocephin. Clinically improving. No fever in last 24 hours. WBC count normalized. No growth in blood culture urine culture so far. Will discharge her on 7 more days of oral cefadroxil. Recent Labs  Lab 06/13/22 0840 06/14/22 1314 06/14/22 1328 06/14/22 1505 06/15/22 0425 06/16/22 0436  WBC 10.7* 12.8*  --   --  5.5 5.7  LATICACIDVEN  --   --  1.0 1.4  --   --    Hyponatremia Sodium level low at 133 today.  Continue to monitor Recent Labs  Lab 06/13/22 0840 06/14/22 1314 06/14/22 2317 06/15/22 0425 06/16/22 0436  NA 132* 132* 137 133* 133*    Anxiety/depression Continue fluoxetine, trazodone.  Wounds:  -    Discharge Exam:   Vitals:   06/16/22 0500 06/16/22 1228 06/16/22 2057 06/17/22 0434  BP:  110/68 (!) 122/91 118/66  Pulse:  65 89 86  Resp:  17 20 19   Temp: 98.7 F (37.1 C) 98.9 F (37.2 C) 98.3 F (36.8 C) 99.4 F (37.4 C)  TempSrc: Oral Oral Oral Oral  SpO2:  100% 98% 98%  Weight:      Height:        Body mass index is 39.45 kg/m.  General exam: Pleasant, obese African-American female.  Lying on bed. Skin: No rashes, lesions or ulcers. HEENT: Atraumatic, normocephalic, no obvious bleeding Lungs: Clear to auscultation bilaterally CVS: Regular rate and rhythm, no murmur GI/Abd: soft, tenderness, improving right CVA tenderness, distended from obesity, bowel sound present. CNS: Alert, awake, oriented x 3 Psychiatry: Mood appropriate Extremities: No pedal edema, no calf tenderness  Follow ups:    Follow-up Information     de Peru, Buren Kos, MD Follow up.   Specialty: Family Medicine Contact information: [address] [place] [address] [phone]         de [place], Buren Kos, MD Follow up.   Specialty: Family Medicine Contact  information: [address] [place] [address] [phone]                 Discharge Instructions:   Discharge Instructions     Call MD for:  difficulty breathing, headache or visual disturbances   Complete by: As directed    Call MD for:  extreme fatigue   Complete by: As directed    Call MD for:  hives   Complete by: As directed    Call MD for:  persistant dizziness or light-headedness   Complete by: As directed    Call MD for:  persistant nausea and vomiting   Complete by: As directed    Call MD for:  severe uncontrolled pain   Complete by: As directed    Call MD for:  temperature >100.4   Complete by: As directed    Diet general   Complete by: As directed    Discharge instructions   Complete by: As directed    Recommendations at discharge:   7 more days of oral cefadroxil.  As needed tramadol for pain control.  General discharge instructions: Follow with Primary MD de Peru, Raymond J, MD in 7 days  Please request your PCP  to go over your hospital tests, procedures, radiology results at the follow up. Please get your medicines reviewed and adjusted.  Your PCP may decide to repeat certain labs or tests as needed. Do not drive, operate heavy machinery, perform activities at heights, swimming or participation in water activities or provide baby sitting services if your were admitted for syncope or siezures until you have seen by Primary MD or a Neurologist and advised to do so again. [place] Controlled Substance Reporting System database was reviewed. Do not drive, operate heavy machinery, perform activities at heights, swim, participate in water activities or provide baby-sitting services while on medications for pain, sleep and mood until your outpatient physician has reevaluated you and advised to do so again.  You are strongly recommended to comply with the dose, frequency and duration of prescribed medications. Activity: As tolerated with Full fall  precautions use walker/cane & assistance as needed Avoid using any recreational substances like cigarette, tobacco, alcohol, or non-prescribed drug. If you experience worsening of your admission symptoms, develop shortness of breath, life threatening emergency, suicidal or homicidal thoughts you must seek medical attention immediately by calling 911 or calling your MD immediately  if symptoms less severe. You must read complete instructions/literature along with all the possible adverse reactions/side effects for all the medicines you take and that have been prescribed to you. Take any new medicine only after you have completely understood and accepted all the possible adverse reactions/side effects.  Wear Seat belts while driving. You were cared for by a hospitalist during your hospital stay. If you have any questions about your discharge medications or the care you received while you were in the hospital after you are discharged, you can call the unit and ask to speak with the hospitalist or the covering physician. Once you are discharged, your primary care physician will handle any further medical issues. Please note that NO REFILLS for any discharge medications will be authorized once you are  discharged, as it is imperative that you return to your primary care physician (or establish a relationship with a primary care physician if you do not have one).   Increase activity slowly   Complete by: As directed        Discharge Medications:   Allergies as of 06/17/2022   No Known Allergies      Medication List     STOP taking these medications    levofloxacin 750 MG tablet Commonly known as: Levaquin       TAKE these medications    cefadroxil 500 MG capsule Commonly known as: DURICEF Take 2 capsules (1,000 mg total) by mouth 2 (two) times daily for 7 days.   FLUoxetine 10 MG capsule Commonly known as: PROZAC Take 1 capsule (10 mg total) by mouth daily.   ondansetron 4 MG  tablet Commonly known as: ZOFRAN Take 1 tablet (4 mg total) by mouth every 6 (six) hours as needed for up to 5 days for nausea.   oxyCODONE 5 MG immediate release tablet Commonly known as: Oxy IR/ROXICODONE Take 1 tablet (5 mg total) by mouth every 6 (six) hours as needed for up to 5 days for moderate pain.   saccharomyces boulardii 250 MG capsule Commonly known as: FLORASTOR Take 1 capsule (250 mg total) by mouth 2 (two) times daily for 7 days.   traZODone 50 MG tablet Commonly known as: DESYREL Take 1 tablet (50 mg total) by mouth at bedtime as needed for sleep.         The results of significant diagnostics from this hospitalization (including imaging, microbiology, ancillary and laboratory) are listed below for reference.    Procedures and Diagnostic Studies:   CT ABDOMEN PELVIS W CONTRAST  Result Date: 06/14/2022 CLINICAL DATA:  Abdomen pain fever EXAM: CT ABDOMEN AND PELVIS WITH CONTRAST TECHNIQUE: Multidetector CT imaging of the abdomen and pelvis was performed using the standard protocol following bolus administration of intravenous contrast. RADIATION DOSE REDUCTION: This exam was performed according to the departmental dose-optimization program which includes automated exposure control, adjustment of the mA and/or kV according to patient size and/or use of iterative reconstruction technique. CONTRAST:  173mL OMNIPAQUE IOHEXOL 300 MG/ML  SOLN COMPARISON:  CT 05/03/2022 FINDINGS: Lower chest: No acute abnormality. Hepatobiliary: No focal liver abnormality is seen. No gallstones, gallbladder wall thickening, or biliary dilatation. Pancreas: Unremarkable. No pancreatic ductal dilatation or surrounding inflammatory changes. Spleen: Normal in size without focal abnormality. Adrenals/Urinary Tract: Adrenal glands are normal. No hydronephrosis. Mild bilateral perinephric stranding. Suspicion of mild urothelial enhancement of right ureter. Probable focal hypoenhancement upper pole right  kidney, series 2, image 23 and coronal series 4, image 102. Slightly thick-walled appearance of urinary bladder with mild perivesical stranding Stomach/Bowel: Stomach is within normal limits. Appendix appears normal. No evidence of bowel wall thickening, distention, or inflammatory changes. Vascular/Lymphatic: No significant vascular findings are present. No enlarged abdominal or pelvic lymph nodes. Reproductive: IUD in the uterus.  No suspicious adnexal mass. Other: Negative for pelvic effusion or free air. Small fat containing umbilical hernia Musculoskeletal: No acute or significant osseous findings. IMPRESSION: 1. Slightly thick-walled appearance of the urinary bladder with mild perivesical stranding, suspect for cystitis. Mild bilateral perinephric stranding with suggestion of mild urothelial enhancement of right ureter and probable focal hypoenhancement in the upper pole of the right kidney suspicious for pyelonephritis. No hydronephrosis. Electronically Signed   By: Donavan Foil M.D.   On: 06/14/2022 16:12   DG Chest [facility] 1 View  Result  Date: 06/14/2022 CLINICAL DATA:  Questionable sepsis. Severe nausea and vomiting. Body aches. EXAM: PORTABLE CHEST 1 VIEW COMPARISON:  None Available. FINDINGS: The heart size and mediastinal contours are within normal limits. Both lungs are clear. The visualized skeletal structures are unremarkable. IMPRESSION: No active disease. Electronically Signed   By: San Morelle M.D.   On: 06/14/2022 13:26     Labs:   Basic Metabolic Panel: Recent Labs  Lab 06/13/22 0840 06/14/22 1314 06/14/22 1505 06/14/22 2317 06/15/22 0425 06/16/22 0436  NA 132* 132*  --  137 133* 133*  K 3.5 2.8*  --  5.5* 3.4* 3.6  CL 98 100  --  112* 104 104  CO2 23 20*  --  16* 19* 20*  GLUCOSE 95 96  --  139* 86 90  BUN 9 11  --  11 8 7   CREATININE 0.93 0.86  --  1.30* 0.68 0.60  CALCIUM 8.7* 8.6*  --  8.3* 7.9* 8.2*  MG  --   --  1.9  --   --   --    GFR Estimated  Creatinine Clearance: 143.7 mL/min (by C-G formula based on SCr of 0.6 mg/dL). Liver Function Tests: Recent Labs  Lab 06/13/22 0840 06/14/22 1314  AST 26 27  ALT 22 29  ALKPHOS 60 73  BILITOT 0.7 1.1  PROT 7.8 8.0  ALBUMIN 3.8 3.7   Recent Labs  Lab 06/13/22 0840  LIPASE 23   No results for input(s): "AMMONIA" in the last 168 hours. Coagulation profile Recent Labs  Lab 06/14/22 1414  INR 1.5*    CBC: Recent Labs  Lab 06/13/22 0840 06/14/22 1314 06/15/22 0425 06/16/22 0436  WBC 10.7* 12.8* 5.5 5.7  NEUTROABS  --  10.1*  --  3.0  HGB 15.1* 13.5 11.1* 12.0  HCT 44.9 38.6 33.8* 36.2  MCV 89.6 87.5 91.6 90.0  PLT 258 192 179 208   Cardiac Enzymes: No results for input(s): "CKTOTAL", "CKMB", "CKMBINDEX", "TROPONINI" in the last 168 hours. BNP: Invalid input(s): "POCBNP" CBG: No results for input(s): "GLUCAP" in the last 168 hours. D-Dimer No results for input(s): "DDIMER" in the last 72 hours. Hgb A1c No results for input(s): "HGBA1C" in the last 72 hours. Lipid Profile No results for input(s): "CHOL", "HDL", "LDLCALC", "TRIG", "CHOLHDL", "LDLDIRECT" in the last 72 hours. Thyroid function studies No results for input(s): "TSH", "T4TOTAL", "T3FREE", "THYROIDAB" in the last 72 hours.  Invalid input(s): "FREET3" Anemia work up No results for input(s): "VITAMINB12", "FOLATE", "FERRITIN", "TIBC", "IRON", "RETICCTPCT" in the last 72 hours. Microbiology Recent Results (from the past 240 hour(s))  Blood culture (routine x 2)     Status: None (Preliminary result)   Collection Time: 06/14/22  1:14 PM   Specimen: BLOOD  Result Value Ref Range Status   Specimen Description BLOOD RIGHT ANTECUBITAL  Final   Special Requests   Final    BOTTLES DRAWN AEROBIC AND ANAEROBIC Blood Culture results may not be optimal due to an excessive volume of blood received in culture bottles   Culture   Final    NO GROWTH 3 DAYS Performed at [facility], [place] [address]  [place], [place] [address]    Report Status PENDING  Incomplete  Blood culture (routine x 2)     Status: None (Preliminary result)   Collection Time: 06/14/22  1:28 PM   Specimen: BLOOD  Result Value Ref Range Status   Specimen Description BLOOD LEFT ANTECUBITAL  Final   Special Requests  Final    BOTTLES DRAWN AEROBIC AND ANAEROBIC Blood Culture results may not be optimal due to an excessive volume of blood received in culture bottles   Culture   Final    NO GROWTH 3 DAYS Performed at [facility], [place] [address], [place], [place] [address]    Report Status PENDING  Incomplete  Resp panel by RT-PCR (RSV, Flu A&B, Covid) Anterior Nasal Swab     Status: None   Collection Time: 06/14/22  2:31 PM   Specimen: Anterior Nasal Swab  Result Value Ref Range Status   SARS Coronavirus 2 by RT PCR NEGATIVE NEGATIVE Final    Comment: (NOTE) SARS-CoV-2 target nucleic acids are NOT DETECTED.  The SARS-CoV-2 RNA is generally detectable in upper respiratory specimens during the acute phase of infection. The lowest concentration of SARS-CoV-2 viral copies this assay can detect is 138 copies/mL. A negative result does not preclude SARS-Cov-2 infection and should not be used as the sole basis for treatment or other patient management decisions. A negative result may occur with  improper specimen collection/handling, submission of specimen other than nasopharyngeal swab, presence of viral mutation(s) within the areas targeted by this assay, and inadequate number of viral copies(<138 copies/mL). A negative result must be combined with clinical observations, patient history, and epidemiological information. The expected result is Negative.  Fact Sheet for Patients:  EntrepreneurPulse.com.au  Fact Sheet for Healthcare Providers:  IncredibleEmployment.be  This test is no t yet approved or cleared by the [place] FDA and  has been authorized for detection  and/or diagnosis of SARS-CoV-2 by FDA under an Emergency Use Authorization (EUA). This EUA will remain  in effect (meaning this test can be used) for the duration of the COVID-19 declaration under Section 564(b)(1) of the Act, 21 U.S.C.section 360bbb-3(b)(1), unless the authorization is terminated  or revoked sooner.       Influenza A by PCR NEGATIVE NEGATIVE Final   Influenza B by PCR NEGATIVE NEGATIVE Final    Comment: (NOTE) The Xpert Xpress SARS-CoV-2/FLU/RSV plus assay is intended as an aid in the diagnosis of influenza from Nasopharyngeal swab specimens and should not be used as a sole basis for treatment. Nasal washings and aspirates are unacceptable for Xpert Xpress SARS-CoV-2/FLU/RSV testing.  Fact Sheet for Patients: EntrepreneurPulse.com.au  Fact Sheet for Healthcare Providers: IncredibleEmployment.be  This test is not yet approved or cleared by the [place] FDA and has been authorized for detection and/or diagnosis of SARS-CoV-2 by FDA under an Emergency Use Authorization (EUA). This EUA will remain in effect (meaning this test can be used) for the duration of the COVID-19 declaration under Section 564(b)(1) of the Act, 21 U.S.C. section 360bbb-3(b)(1), unless the authorization is terminated or revoked.     Resp Syncytial Virus by PCR NEGATIVE NEGATIVE Final    Comment: (NOTE) Fact Sheet for Patients: EntrepreneurPulse.com.au  Fact Sheet for Healthcare Providers: IncredibleEmployment.be  This test is not yet approved or cleared by the [place] FDA and has been authorized for detection and/or diagnosis of SARS-CoV-2 by FDA under an Emergency Use Authorization (EUA). This EUA will remain in effect (meaning this test can be used) for the duration of the COVID-19 declaration under Section 564(b)(1) of the Act, 21 U.S.C. section 360bbb-3(b)(1), unless the authorization is terminated  or revoked.  Performed at [facility], [place] [address] [place], [place] [address]   Group A Strep by PCR     Status: None   Collection Time: 06/14/22  2:31 PM   Specimen: Anterior Nasal Swab; Sterile Swab  Result Value Ref Range Status   Group A Strep by PCR NOT DETECTED NOT DETECTED Final    Comment: Performed at [facility], [place] [address], [place], [place] [address]    Time coordinating discharge: 35 minutes  Signed: Marlowe Aschoff Deondray Ospina  [facility] 06/17/2022, 11:36 AM

## 2022-06-18 ENCOUNTER — Telehealth: Payer: Self-pay

## 2022-06-18 NOTE — Patient Outreach (Signed)
Transition Care Management Follow-up Telephone Call Date of discharge and from where: 06/17/22 [facility] How have you been since you were released from the hospital? I feel better now, however my service at [facility] was not good and I called and left a voicemail. Any questions or concerns? Yes  Items Reviewed: Did the pt receive and understand the discharge instructions provided? Yes  Medications obtained and verified? Yes  Other? No  Any new allergies since your discharge? No  Dietary orders reviewed? Yes Do you have support at home? Yes   Home Care and Equipment/Supplies: Were home health services ordered? not applicable If so, what is the name of the agency?   Has the agency set up a time to come to the patient's home? not applicable Were any new equipment or medical supplies ordered?  No What is the name of the medical supply agency?  Were you able to get the supplies/equipment? not applicable Do you have any questions related to the use of the equipment or supplies? No  Functional Questionnaire: (I = Independent and D = Dependent) ADLs: I  Bathing/Dressing- I  Meal Prep- I  Eating- I  Maintaining continence- I  Transferring/Ambulation- I  Managing Meds- I  Follow up appointments reviewed:  PCP Hospital f/u appt confirmed? No  Scheduled to see  on  @ . [facility]ist Hospital f/u appt confirmed? Yes  Scheduled to see GI on 07/01/22 @ 10:10. Are transportation arrangements needed? No  If their condition worsens, is the pt aware to call PCP or go to the Emergency Dept.? Yes Was the patient provided with contact information for the PCP's office or ED? Yes Was to pt encouraged to call back with questions or concerns? Yes

## 2022-06-19 LAB — CULTURE, BLOOD (ROUTINE X 2)
Culture: NO GROWTH
Culture: NO GROWTH

## 2022-06-19 NOTE — ED Provider Notes (Signed)
Claudia Moon   CSN: [id number] Arrival date & time: 06/13/22  6295     History  Chief Complaint  Patient presents with   Abdominal Pain    Claudia Moon is a 26 y.o. female.  Pt presented with c/o right flank pain/r abd pain for the past two days. Symptoms acute onset, moderate, persistent. No vaginal discharge or bleeding. +fever. No vomiting. No recent abx use. No hx kidney stone. No hx recurrent pyelo.   The history is provided by the patient and medical records.  Abdominal Pain Associated symptoms: dysuria and fever   Associated symptoms: no chest pain, no shortness of breath, no sore throat and no vomiting        Home Medications Prior to Admission medications   Medication Sig Start Date End Date Taking? Authorizing Provider  cefadroxil (DURICEF) 500 MG capsule Take 2 capsules (1,000 mg total) by mouth 2 (two) times daily for 7 days. 06/17/22 06/24/22  Lorin Glass, MD  FLUoxetine (PROZAC) 10 MG capsule Take 1 capsule (10 mg total) by mouth daily. 11/20/21 11/20/22  Nwoko, Tommas Olp, PA  ondansetron (ZOFRAN) 4 MG tablet Take 1 tablet (4 mg total) by mouth every 6 (six) hours as needed for up to 5 days for nausea. 06/17/22 06/22/22  Lorin Glass, MD  oxyCODONE (OXY IR/ROXICODONE) 5 MG immediate release tablet Take 1 tablet (5 mg total) by mouth every 6 (six) hours as needed for up to 5 days for moderate pain. 06/17/22 06/22/22  Lorin Glass, MD  saccharomyces boulardii (FLORASTOR) 250 MG capsule Take 1 capsule (250 mg total) by mouth 2 (two) times daily for 7 days. 06/17/22 06/24/22  Lorin Glass, MD  traZODone (DESYREL) 50 MG tablet Take 1 tablet (50 mg total) by mouth at bedtime as needed for sleep. 11/20/21   Meta Hatchet, PA      Allergies    Patient has no known allergies.    Review of Systems   Review of Systems  Constitutional:  Positive for fever.  HENT:  Negative for sore throat.   Eyes:  Negative for  redness.  Respiratory:  Negative for shortness of breath.   Cardiovascular:  Negative for chest pain.  Gastrointestinal:  Positive for abdominal pain. Negative for vomiting.  Genitourinary:  Positive for dysuria and flank pain.  Musculoskeletal:  Negative for neck pain.  Skin:  Negative for rash.  Neurological:  Negative for light-headedness and headaches.  Hematological:  Does not bruise/bleed easily.  Psychiatric/Behavioral:  Negative for confusion.     Physical Exam Updated Vital Signs BP 127/86 (BP Location: Right Arm)   Pulse (!) 116   Temp 99.2 F (37.3 C) (Oral)   Resp 17   Ht 1.727 m (5\' 8" )   Wt 117.9 kg   SpO2 100%   BMI 39.53 kg/m  Physical Exam Vitals and nursing Moon reviewed.  Constitutional:      Appearance: Normal appearance. She is well-developed.  HENT:     Head: Atraumatic.     Nose: Nose normal.     Mouth/Throat:     Mouth: Mucous membranes are moist.  Eyes:     General: No scleral icterus.    Conjunctiva/sclera: Conjunctivae normal.  Neck:     Trachea: No tracheal deviation.  Cardiovascular:     Rate and Rhythm: Normal rate and regular rhythm.     Pulses: Normal pulses.     Heart sounds: Normal heart sounds. No murmur heard.  No friction rub. No gallop.  Pulmonary:     Effort: Pulmonary effort is normal. No respiratory distress.     Breath sounds: Normal breath sounds.  Abdominal:     General: Bowel sounds are normal. There is no distension.     Palpations: Abdomen is soft. There is no mass.     Tenderness: There is abdominal tenderness. There is no guarding or rebound.     Hernia: No hernia is present.     Comments: Right abd tenderness.   Genitourinary:    Comments: ?mild right cva tenderness.  Musculoskeletal:        General: No swelling.     Cervical back: Normal range of motion and neck supple. No rigidity. No muscular tenderness.  Skin:    General: Skin is warm and dry.     Findings: No rash.  Neurological:     Mental Status:  She is alert.     Comments: Alert, speech normal.   Psychiatric:        Mood and Affect: Mood normal.     ED Results / Procedures / Treatments   Labs (all labs ordered are listed, but only abnormal results are displayed) Results for orders placed or performed during the hospital encounter of 06/13/22  Lipase, blood  Result Value Ref Range   Lipase 23 11 - 51 U/L  Comprehensive metabolic panel  Result Value Ref Range   Sodium 132 (L) 135 - 145 mmol/L   Potassium 3.5 3.5 - 5.1 mmol/L   Chloride 98 98 - 111 mmol/L   CO2 23 22 - 32 mmol/L   Glucose, Bld 95 70 - 99 mg/dL   BUN 9 6 - 20 mg/dL   Creatinine, Ser 2.24 0.44 - 1.00 mg/dL   Calcium 8.7 (L) 8.9 - 10.3 mg/dL   Total Protein 7.8 6.5 - 8.1 g/dL   Albumin 3.8 3.5 - 5.0 g/dL   AST 26 15 - 41 U/L   ALT 22 0 - 44 U/L   Alkaline Phosphatase 60 38 - 126 U/L   Total Bilirubin 0.7 0.3 - 1.2 mg/dL   GFR, Estimated >49 >75 mL/min   Anion gap 11 5 - 15  CBC  Result Value Ref Range   WBC 10.7 (H) 4.0 - 10.5 K/uL   RBC 5.01 3.87 - 5.11 MIL/uL   Hemoglobin 15.1 (H) 12.0 - 15.0 g/dL   HCT 30.0 51.1 - 02.1 %   MCV 89.6 80.0 - 100.0 fL   MCH 30.1 26.0 - 34.0 pg   MCHC 33.6 30.0 - 36.0 g/dL   RDW 11.7 35.6 - 70.1 %   Platelets 258 150 - 400 K/uL   nRBC 0.0 0.0 - 0.2 %  Urinalysis, Routine w reflex microscopic Urine, Clean Catch  Result Value Ref Range   Color, Urine YELLOW YELLOW   APPearance CLOUDY (A) CLEAR   Specific Gravity, Urine 1.014 1.005 - 1.030   pH 6.0 5.0 - 8.0   Glucose, UA NEGATIVE NEGATIVE mg/dL   Hgb urine dipstick MODERATE (A) NEGATIVE   Bilirubin Urine NEGATIVE NEGATIVE   Ketones, ur 5 (A) NEGATIVE mg/dL   Protein, ur 30 (A) NEGATIVE mg/dL   Nitrite NEGATIVE NEGATIVE   Leukocytes,Ua LARGE (A) NEGATIVE   RBC / HPF 11-20 0 - 5 RBC/hpf   WBC, UA >50 (H) 0 - 5 WBC/hpf   Bacteria, UA FEW (A) NONE SEEN   Squamous Epithelial / LPF 21-50 0 - 5   WBC Clumps PRESENT  Mucus PRESENT    Amorphous Crystal PRESENT    I-Stat beta hCG blood, ED  Result Value Ref Range   I-stat hCG, quantitative <5.0 <5 mIU/mL   Comment 3           CT ABDOMEN PELVIS W CONTRAST  Result Date: 06/14/2022 CLINICAL DATA:  Abdomen pain fever EXAM: CT ABDOMEN AND PELVIS WITH CONTRAST TECHNIQUE: Multidetector CT imaging of the abdomen and pelvis was performed using the standard protocol following bolus administration of intravenous contrast. RADIATION DOSE REDUCTION: This exam was performed according to the departmental dose-optimization program which includes automated exposure control, adjustment of the mA and/or kV according to patient size and/or use of iterative reconstruction technique. CONTRAST:  OMNIPAQUE IOHEXOL 300 MG/ML  SOLN COMPARISON:  CT 05/03/2022 FINDINGS: Lower chest: No acute abnormality. Hepatobiliary: No focal liver abnormality is seen. No gallstones, gallbladder wall thickening, or biliary dilatation. Pancreas: Unremarkable. No pancreatic ductal dilatation or surrounding inflammatory changes. Spleen: Normal in size without focal abnormality. Adrenals/Urinary Tract: Adrenal glands are normal. No hydronephrosis. Mild bilateral perinephric stranding. Suspicion of mild urothelial enhancement of right ureter. Probable focal hypoenhancement upper pole right kidney, series 2, image 23 and coronal series 4, image 102. Slightly thick-walled appearance of urinary bladder with mild perivesical stranding Stomach/Bowel: Stomach is within normal limits. Appendix appears normal. No evidence of bowel wall thickening, distention, or inflammatory changes. Vascular/Lymphatic: No significant vascular findings are present. No enlarged abdominal or pelvic lymph nodes. Reproductive: IUD in the uterus.  No suspicious adnexal mass. Other: Negative for pelvic effusion or free air. Small fat containing umbilical hernia Musculoskeletal: No acute or significant osseous findings. IMPRESSION: 1. Slightly thick-walled appearance of the urinary  bladder with mild perivesical stranding, suspect for cystitis. Mild bilateral perinephric stranding with suggestion of mild urothelial enhancement of right ureter and probable focal hypoenhancement in the upper pole of the right kidney suspicious for pyelonephritis. No hydronephrosis. Electronically Signed   By: Jasmine Pang M.D.   On: 06/14/2022 16:12   DG Chest Port 1 View  Result Date: 06/14/2022 CLINICAL DATA:  Questionable sepsis. Severe nausea and vomiting. Body aches. EXAM: PORTABLE CHEST 1 VIEW COMPARISON:  None Available. FINDINGS: The heart size and mediastinal contours are within normal limits. Both lungs are clear. The visualized skeletal structures are unremarkable. IMPRESSION: No active disease. Electronically Signed   By: Marin Roberts M.D.   On: 06/14/2022 13:26     EKG None  Radiology No results found.  Procedures Procedures    Medications Ordered in ED Medications  acetaminophen (TYLENOL) tablet 650 mg (650 mg Oral Given 06/13/22 0832)  ibuprofen (ADVIL) tablet 400 mg (400 mg Oral Given 06/13/22 1241)  cefTRIAXone (ROCEPHIN) 2 g in sodium chloride 0.9 % 100 mL IVPB (0 g Intravenous Stopped 06/13/22 1337)  lactated ringers bolus 1,000 mL (0 mLs Intravenous Stopped 06/13/22 1337)    ED Course/ Medical Decision Making/ A&P                           Medical Decision Making Amount and/or Complexity of Data Reviewed Labs: ordered.  Risk OTC drugs.   Iv ns. Continuous pulse ox and cardiac monitoring. Labs ordered/sent. Imaging ordered.   Diff dx includes uti, gallstones, pyelo, appendicitis, etc - dispo decision including potential need for admission considered - will get labs, imaging and reassess.   Reviewed nursing notes and prior charts for additional history. External reports reviewed.   Cardiac monitor: sinus rhythm, rate  88.  Labs reviewed/interpreted by me - wbc 10. Cr normal. Ua c/w uti.  Iv abx. Ivf bolus.   CT ordered - pt left ED prior to  imaging, requested d/c, not willing to stay for imaging, additional fluids or testing.     Pt remains not willing to stay, requests d/c. Pt leaving ama prior to advised eval/tx. Instructed to return if reconsiders, and/or wants additional testing/tx.            Final Clinical Impression(s) / ED Diagnoses Final diagnoses:  Pyelonephritis    Rx / DC Orders ED Discharge Orders          Ordered    levofloxacin (LEVAQUIN) 750 MG tablet  Daily,   Status:  Discontinued        06/13/22 1350              Cathren LaineSteinl, Arriyana Rodell, MD 06/19/22 1111

## 2022-06-26 ENCOUNTER — Ambulatory Visit (INDEPENDENT_AMBULATORY_CARE_PROVIDER_SITE_OTHER): Payer: Medicaid Other | Admitting: Licensed Clinical Social Worker

## 2022-06-26 DIAGNOSIS — F331 Major depressive disorder, recurrent, moderate: Secondary | ICD-10-CM

## 2022-06-26 NOTE — Progress Notes (Signed)
THERAPIST PROGRESS NOTE  Virtual Visit via Video Note  I connected with Claudia Moon on 06/26/22 at 11:00 AM EST by a video enabled telemedicine application and verified that I am speaking with the correct person using two identifiers.  Location: Patient: Claudia Moon  Provider: Provider Home    I discussed the limitations of evaluation and management by telemedicine and the availability of in person appointments. The patient expressed understanding and agreed to proceed.     I discussed the assessment and treatment plan with the patient. The patient was provided an opportunity to ask questions and all were answered. The patient agreed with the plan and demonstrated an understanding of the instructions.   The patient was advised to call back or seek an in-person evaluation if the symptoms worsen or if the condition fails to improve as anticipated.  I provided 30 minutes of non-face-to-face time during this encounter.   Claudia Horn, LCSW   Participation Level: Active  Behavioral Response: CasualAlertAnxious and Depressed  Type of Therapy: Individual Therapy  Treatment Goals addressed:   Active     Anxiety Disorder CCP Problem  1 Substance use mood disorder       identify 3 trigger for anxiety  (Progressing)     Start:  11/14/21    Expected End:  09/26/22         walk 3 x weekly  (Not Progressing)     Start:  11/14/21    Expected End:  09/26/22         create 3 coping skills to decrease marijuana use which is daily  (Progressing)     Start:  11/14/21    Expected End:  09/26/22         LTG: Patient will score less than 5 on the Generalized Anxiety Disorder 7 Scale (GAD-7) (Not Progressing)     Start:  11/14/21    Expected End:  09/26/22         STG: Patient will attend at least 80% of scheduled group psychotherapy sessions (Not Met (add Reason))     Start:  11/14/21    Expected End:  09/26/22       Goal Note     Not doing group therapy at this  time          STG: Patient will complete at least 80% of assigned homework (Progressing)     Start:  11/14/21    Expected End:  09/26/22           Depression CCP Problem  1 MDD      LTG: [name] WILL SCORE LESS THAN 10 ON THE PATIENT HEALTH QUESTIONNAIRE (PHQ-9) (Completed/Met)     Start:  11/14/21    Expected End:  09/26/22    Resolved:  06/26/22      STG: [name] WILL PARTICIPATE IN AT LEAST 80% OF SCHEDULED INDIVIDUAL PSYCHOTHERAPY SESSIONS (Progressing)     Start:  11/14/21    Expected End:  09/26/22         STG: [name] WILL COMPLETE AT LEAST 80% OF ASSIGNED HOMEWORK (Progressing)     Start:  11/14/21    Expected End:  09/26/22            ProgressTowards Goals: Progressing  Interventions: Motivational Interviewing, Supportive, and Reframing    Suicidal/Homicidal: Nowithout intent/plan  Therapist Response:     Pt was alert and oriented x 5. She was dressed casually and engaged well in therapy session. She presented with depressed and anxious  mood/affect. She was pleasant, cooperative and maintained good eye contact.   Pt reports primary stressor as housing and relationship. Claudia Moon states that she has been looking for independent housing that will start in Jan or Feb. She has been in contact with a landlord, but nothing has become available as of late. Pt reports that this has cause her tension and worry. Pt reports poor boundary setting with her significant other. She reports that "He has been going through some stuff" which has been hard for pt to deal with when she has been working 2 jobs, has 2 kids, and looking for housing.   Intervention/Plan: LCSW and pt identified 2/3 trigger specified as goal for treatment plan for significant other and housing. Pt has been decreasing marijuana use to less than half the days per week. 1 coping skill she would like to create is journaling. Goal for pt in regard to coping skills is to create 3 outside of marijuana use. Plan  for pt is to attempt journaling 1 x weekly prior to next session in 3 weeks.    Plan: Return again in 3 weeks.  Diagnosis: Moderate episode of recurrent major depressive disorder ([place])  Collaboration of Care: Other None today   Patient/Guardian was advised Release of Information must be obtained prior to any record release in order to collaborate their care with an outside provider. Patient/Guardian was advised if they have not already done so to contact the registration department to sign all necessary forms in order for [place] to release information regarding their care.   Consent: Patient/Guardian gives verbal consent for treatment and assignment of benefits for services provided during this visit. Patient/Guardian expressed understanding and agreed to proceed.   Claudia Horn, LCSW 06/26/2022

## 2022-07-01 ENCOUNTER — Ambulatory Visit: Payer: Medicaid Other | Admitting: Gastroenterology

## 2022-07-01 ENCOUNTER — Encounter: Payer: Self-pay | Admitting: Gastroenterology

## 2022-07-01 VITALS — BP 118/78 | HR 70 | Ht 68.0 in | Wt 254.0 lb

## 2022-07-01 DIAGNOSIS — K602 Anal fissure, unspecified: Secondary | ICD-10-CM | POA: Diagnosis not present

## 2022-07-01 MED ORDER — AMBULATORY NON FORMULARY MEDICATION: 0 refills | Status: AC

## 2022-07-01 NOTE — Patient Instructions (Signed)
_______________________________________________________  If you are age 27 or older, your body mass index should be between 23-30. Your Body mass index is 38.62 kg/m. If this is out of the aforementioned range listed, please consider follow up with your Primary Care Provider.  If you are age 56 or younger, your body mass index should be between 19-25. Your Body mass index is 38.62 kg/m. If this is out of the aformentioned range listed, please consider follow up with your Primary Care Provider.   We have sent a prescription for nitroglycerin 0.125% gel to [facility]. You should apply a pea size amount to your rectum three times daily x 6-8 weeks.  [facility]'s information is below: Address: [address], [place], [place] [address]  [phone]  *Please DO NOT go directly from our office to pick up this medication! Give the pharmacy 1 day to process the prescription as this is compounded and takes time to make.  Start Metamucil daily .  Take nightly sitz baths.  The  GI providers would like to encourage you to use [facility] to communicate with providers for non-urgent requests or questions.  Due to long hold times on the telephone, sending your provider a message by [facility] may be a faster and more efficient way to get a response.  Please allow 48 business hours for a response.  Please remember that this is for non-urgent requests.   It was a pleasure to see you today!  Thank you for trusting me with your gastrointestinal care!    Scott E. Candis Schatz, MD
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
## 2022-07-10 NOTE — Assessment & Plan Note (Signed)
Patient reports that for the past few months she has noticed mitten rectal bleeding.  This will primarily occur with bowel movements when it happens.  She will additionally have some associated pain at that time. Pain is described as sharp, occasionally burning.  She has not had any abdominal pain, denies any constipation or diarrhea.  No reported hard stools or significant straining.  She reports that at the present time, she is asymptomatic, last reported episode of any rectal pain or bleeding was a few weeks ago. We discussed potential considerations related to ongoing symptoms.  Suspect possibility of anal fissure.  Discussed typical symptoms which can coincide with this.  Given ongoing symptoms, feel that further evaluation with gastroenterology would be reasonable, patient would like to proceed with referral at this time, referral placed

## 2022-07-17 ENCOUNTER — Ambulatory Visit (HOSPITAL_COMMUNITY): Payer: Medicaid Other | Admitting: Licensed Clinical Social Worker

## 2022-07-21 ENCOUNTER — Ambulatory Visit (HOSPITAL_BASED_OUTPATIENT_CLINIC_OR_DEPARTMENT_OTHER): Payer: Medicaid Other | Admitting: Family Medicine

## 2022-07-22 ENCOUNTER — Ambulatory Visit (HOSPITAL_BASED_OUTPATIENT_CLINIC_OR_DEPARTMENT_OTHER): Payer: Medicaid Other | Admitting: Family Medicine

## 2022-07-22 ENCOUNTER — Ambulatory Visit (INDEPENDENT_AMBULATORY_CARE_PROVIDER_SITE_OTHER): Payer: Medicaid Other | Admitting: Licensed Clinical Social Worker

## 2022-07-22 DIAGNOSIS — F331 Major depressive disorder, recurrent, moderate: Secondary | ICD-10-CM

## 2022-07-22 NOTE — Progress Notes (Signed)
   THERAPIST PROGRESS NOTE  Virtual Visit via Video Note  I connected with Claudia Moon on 07/22/22 at  1:00 PM EST by a video enabled telemedicine application and verified that I am speaking with the correct person using two identifiers.  Location: Patient: Patients Home  Provider: [facility]    I discussed the limitations of evaluation and management by telemedicine and the availability of in person appointments. The patient expressed understanding and agreed to proceed.     I discussed the assessment and treatment plan with the patient. The patient was provided an opportunity to ask questions and all were answered. The patient agreed with the plan and demonstrated an understanding of the instructions.   The patient was advised to call back or seek an in-person evaluation if the symptoms worsen or if the condition fails to improve as anticipated.  I provided 30 minutes of non-face-to-face time during this encounter.   Dory Horn, LCSW  Participation Level: Active  Behavioral Response: CasualAlertAnxious and Depressed  Type of Therapy: Individual Therapy  Treatment Goals addressed: identify 3 trigger for anxiety   ProgressTowards Goals: Progressing  Interventions: Motivational Interviewing and Supportive  Summary: [name] is a 27 y.o. female who presents with depressed and anxious mood\affect.  Patient was pleasant, cooperative, maintained good eye contact.  She engaged well in therapy session was dressed casually.Saaya was alert and oriented x 5.  Patient reports primary stressor is relationship.  She states that recently her significant other broke up with her.  Patient reports that they have only been exclusive for about 2 weeks, but recently he got kicked out of his mother's house and patient reports that he was emotional and stated that he was still talking to another woman.  Patient endorses symptoms for sadness, tension, worry, and  hopelessness.  Suicidal/Homicidal: Nowithout intent/plan  Therapist Response:    Intervention/Plan: LCSW psychoanalytic therapy for patient to express thoughts, feelings and emotions.  Patient and LCSW discussed 3 triggers for anxiety and depression 1 being her relationship.  LCSW educated patient on the importance of taking medications as prescribed to decrease anxiety and depression.  LCSW educated patient on the benefits of exercise to decrease anxiety.  Plan: Return again in 3 weeks.  Diagnosis: Moderate episode of recurrent major depressive disorder ([place])  Collaboration of Care: Other None today   Patient/Guardian was advised Release of Information must be obtained prior to any record release in order to collaborate their care with an outside provider. Patient/Guardian was advised if they have not already done so to contact the registration department to sign all necessary forms in order for [place] to release information regarding their care.   Consent: Patient/Guardian gives verbal consent for treatment and assignment of benefits for services provided during this visit. Patient/Guardian expressed understanding and agreed to proceed.   Dory Horn, LCSW 07/22/2022

## 2022-08-08 DIAGNOSIS — R519 Headache, unspecified: Secondary | ICD-10-CM | POA: Diagnosis not present

## 2022-08-08 DIAGNOSIS — Z20828 Contact with and (suspected) exposure to other viral communicable diseases: Secondary | ICD-10-CM | POA: Diagnosis not present

## 2022-08-08 DIAGNOSIS — R0981 Nasal congestion: Secondary | ICD-10-CM | POA: Diagnosis not present

## 2022-08-08 DIAGNOSIS — R6883 Chills (without fever): Secondary | ICD-10-CM | POA: Diagnosis not present

## 2022-08-11 ENCOUNTER — Ambulatory Visit (HOSPITAL_COMMUNITY): Payer: Medicaid Other | Admitting: Licensed Clinical Social Worker

## 2022-08-11 ENCOUNTER — Telehealth (HOSPITAL_COMMUNITY): Payer: Self-pay | Admitting: Licensed Clinical Social Worker

## 2022-08-11 ENCOUNTER — Encounter (HOSPITAL_COMMUNITY): Payer: Self-pay

## 2022-08-11 NOTE — Telephone Encounter (Signed)
LCSW sent two links to pt phone with no response. LCSW f/u with a PC and pt mailbox is full. LCSW waited until 0115 before disconnecting

## 2022-08-20 ENCOUNTER — Ambulatory Visit (HOSPITAL_COMMUNITY): Payer: Medicaid Other | Admitting: Licensed Clinical Social Worker

## 2022-09-01 ENCOUNTER — Ambulatory Visit (HOSPITAL_COMMUNITY): Payer: Medicaid Other | Admitting: Licensed Clinical Social Worker

## 2022-09-10 ENCOUNTER — Ambulatory Visit (HOSPITAL_BASED_OUTPATIENT_CLINIC_OR_DEPARTMENT_OTHER): Payer: Medicaid Other | Admitting: Family Medicine

## 2022-11-11 ENCOUNTER — Telehealth (HOSPITAL_COMMUNITY): Payer: Self-pay | Admitting: Licensed Clinical Social Worker

## 2022-11-11 NOTE — Telephone Encounter (Signed)
LCSW spoke with patient about rescheduling appointments.  Patient has 3 no-shows from July 2023 to February 2024.  Patient also canceled her last appointment. In March 2024.  LCSW advised patient that she could have 1 more opportunity to schedule an appointment or she could come in for a walk-in appointment on Mondays starting at 7:30 AM.  Patient opted for walk-in appointment on Monday starting at 7:30 AM.  LCSW made it clear to patient that it is first come first serve.

## 2023-03-12 DIAGNOSIS — Z131 Encounter for screening for diabetes mellitus: Secondary | ICD-10-CM | POA: Diagnosis not present

## 2023-03-12 DIAGNOSIS — H6123 Impacted cerumen, bilateral: Secondary | ICD-10-CM | POA: Diagnosis not present

## 2023-03-12 DIAGNOSIS — Z975 Presence of (intrauterine) contraceptive device: Secondary | ICD-10-CM | POA: Diagnosis not present

## 2023-03-12 DIAGNOSIS — H6693 Otitis media, unspecified, bilateral: Secondary | ICD-10-CM | POA: Diagnosis not present

## 2023-03-12 DIAGNOSIS — Z79899 Other long term (current) drug therapy: Secondary | ICD-10-CM | POA: Diagnosis not present

## 2023-03-12 DIAGNOSIS — E559 Vitamin D deficiency, unspecified: Secondary | ICD-10-CM | POA: Diagnosis not present

## 2023-03-12 DIAGNOSIS — R5383 Other fatigue: Secondary | ICD-10-CM | POA: Diagnosis not present

## 2023-03-12 DIAGNOSIS — N939 Abnormal uterine and vaginal bleeding, unspecified: Secondary | ICD-10-CM | POA: Diagnosis not present

## 2023-03-12 DIAGNOSIS — N946 Dysmenorrhea, unspecified: Secondary | ICD-10-CM | POA: Diagnosis not present

## 2023-03-12 DIAGNOSIS — J329 Chronic sinusitis, unspecified: Secondary | ICD-10-CM | POA: Diagnosis not present

## 2023-03-12 DIAGNOSIS — Z13220 Encounter for screening for lipoid disorders: Secondary | ICD-10-CM | POA: Diagnosis not present

## 2023-03-12 DIAGNOSIS — Z1159 Encounter for screening for other viral diseases: Secondary | ICD-10-CM | POA: Diagnosis not present

## 2023-09-17 DIAGNOSIS — J020 Streptococcal pharyngitis: Secondary | ICD-10-CM | POA: Diagnosis not present

## 2024-01-10 DIAGNOSIS — L0390 Cellulitis, unspecified: Secondary | ICD-10-CM | POA: Diagnosis not present

## 2024-01-10 DIAGNOSIS — E669 Obesity, unspecified: Secondary | ICD-10-CM | POA: Diagnosis not present

## 2024-01-10 DIAGNOSIS — Z6838 Body mass index (BMI) 38.0-38.9, adult: Secondary | ICD-10-CM | POA: Diagnosis not present

## 2024-01-10 DIAGNOSIS — R030 Elevated blood-pressure reading, without diagnosis of hypertension: Secondary | ICD-10-CM | POA: Diagnosis not present

## 2024-01-25 ENCOUNTER — Encounter (HOSPITAL_BASED_OUTPATIENT_CLINIC_OR_DEPARTMENT_OTHER): Payer: Self-pay | Admitting: *Deleted

## 2024-04-15 DIAGNOSIS — N760 Acute vaginitis: Secondary | ICD-10-CM | POA: Diagnosis not present
